# Patient Record
Sex: Male | Born: 1955 | Race: White | Hispanic: No | Marital: Married | Smoking: Never smoker
Health system: Southern US, Community
[De-identification: ages and names within clinical notes are randomized; demographics above are authoritative.]

## PROBLEM LIST (undated history)

## (undated) DIAGNOSIS — J189 Pneumonia, unspecified organism: Secondary | ICD-10-CM

## (undated) DIAGNOSIS — M1990 Unspecified osteoarthritis, unspecified site: Secondary | ICD-10-CM

## (undated) HISTORY — PX: COLONOSCOPY: SHX174

---

## 2005-06-15 ENCOUNTER — Encounter: Admission: RE | Admit: 2005-06-15 | Discharge: 2005-06-15 | Payer: Self-pay | Admitting: Gastroenterology

## 2007-05-02 ENCOUNTER — Encounter: Admission: RE | Admit: 2007-05-02 | Discharge: 2007-05-02 | Payer: Self-pay | Admitting: Family Medicine

## 2009-06-24 IMAGING — CR DG CHEST 2V
2 series · 2 of 2 positions shown · non-contrast
Comparison: None.

CLINICAL DATA: Cough and fever.
 CHEST - 2 VIEW:

[view not recorded (1 of 2)]
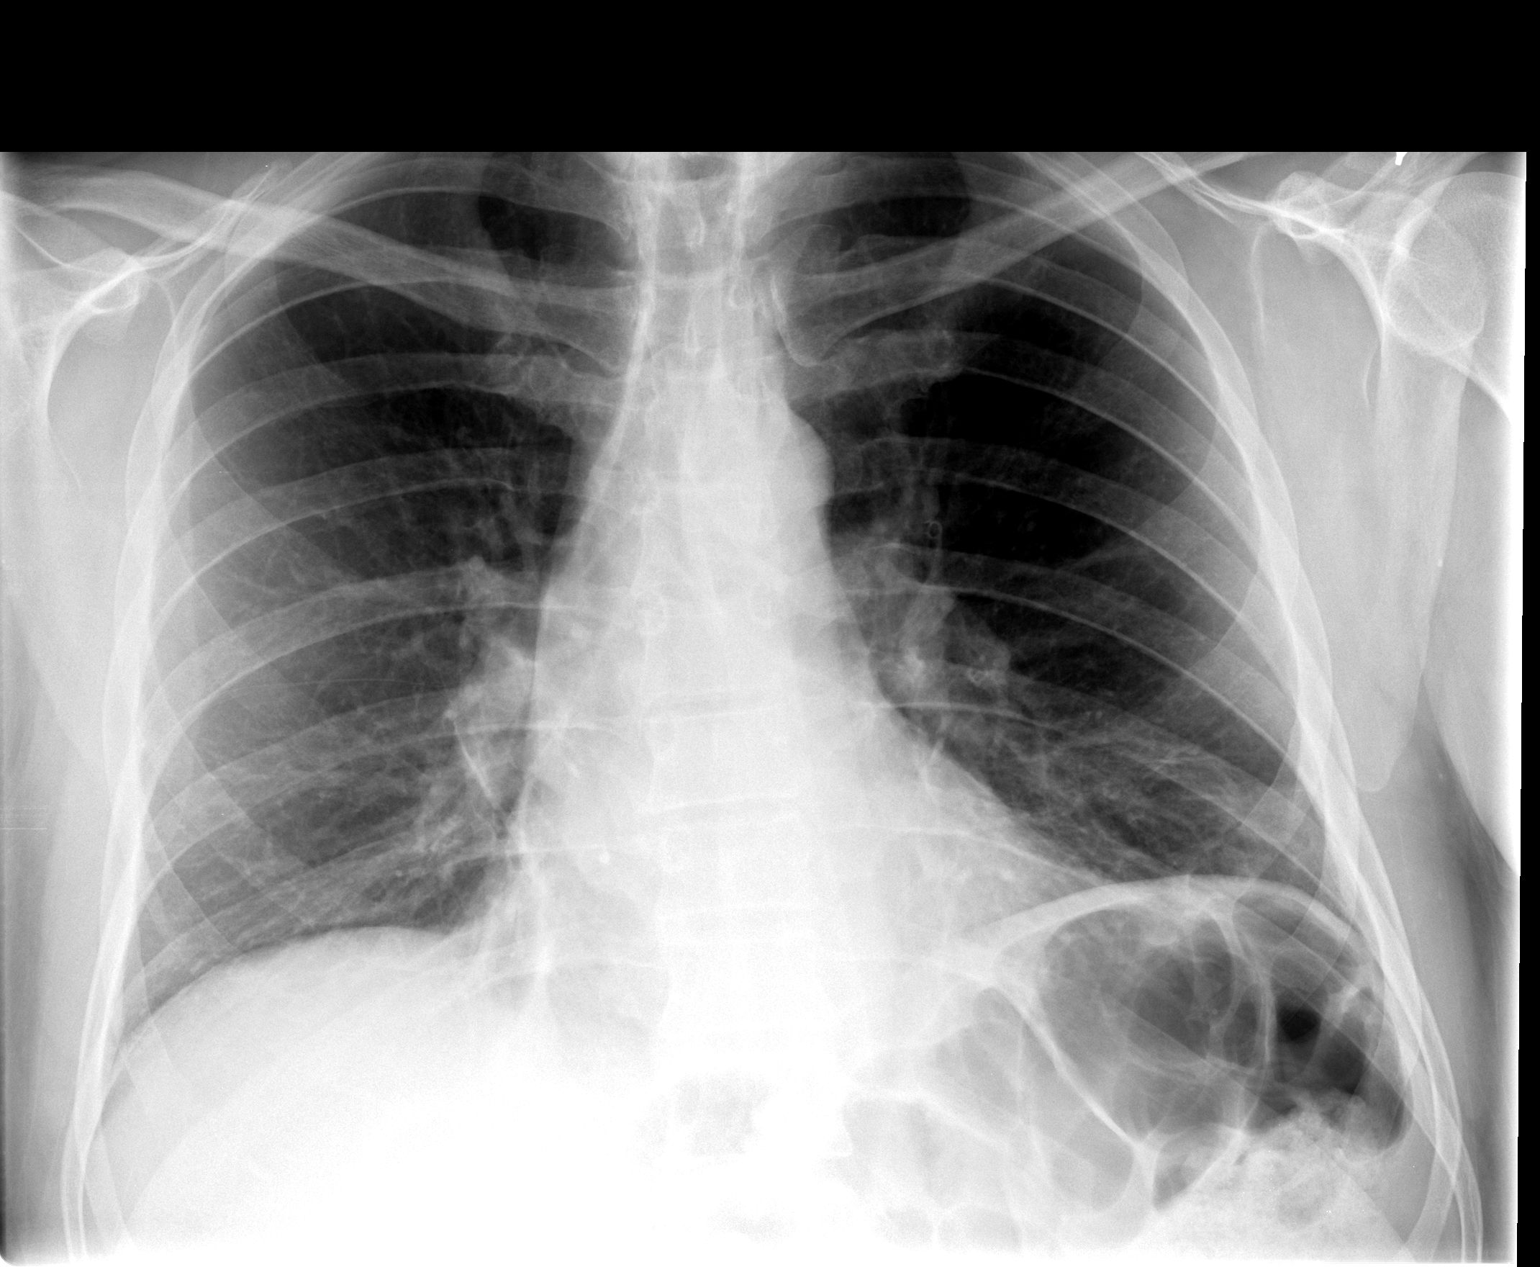

[view not recorded (2 of 2)]
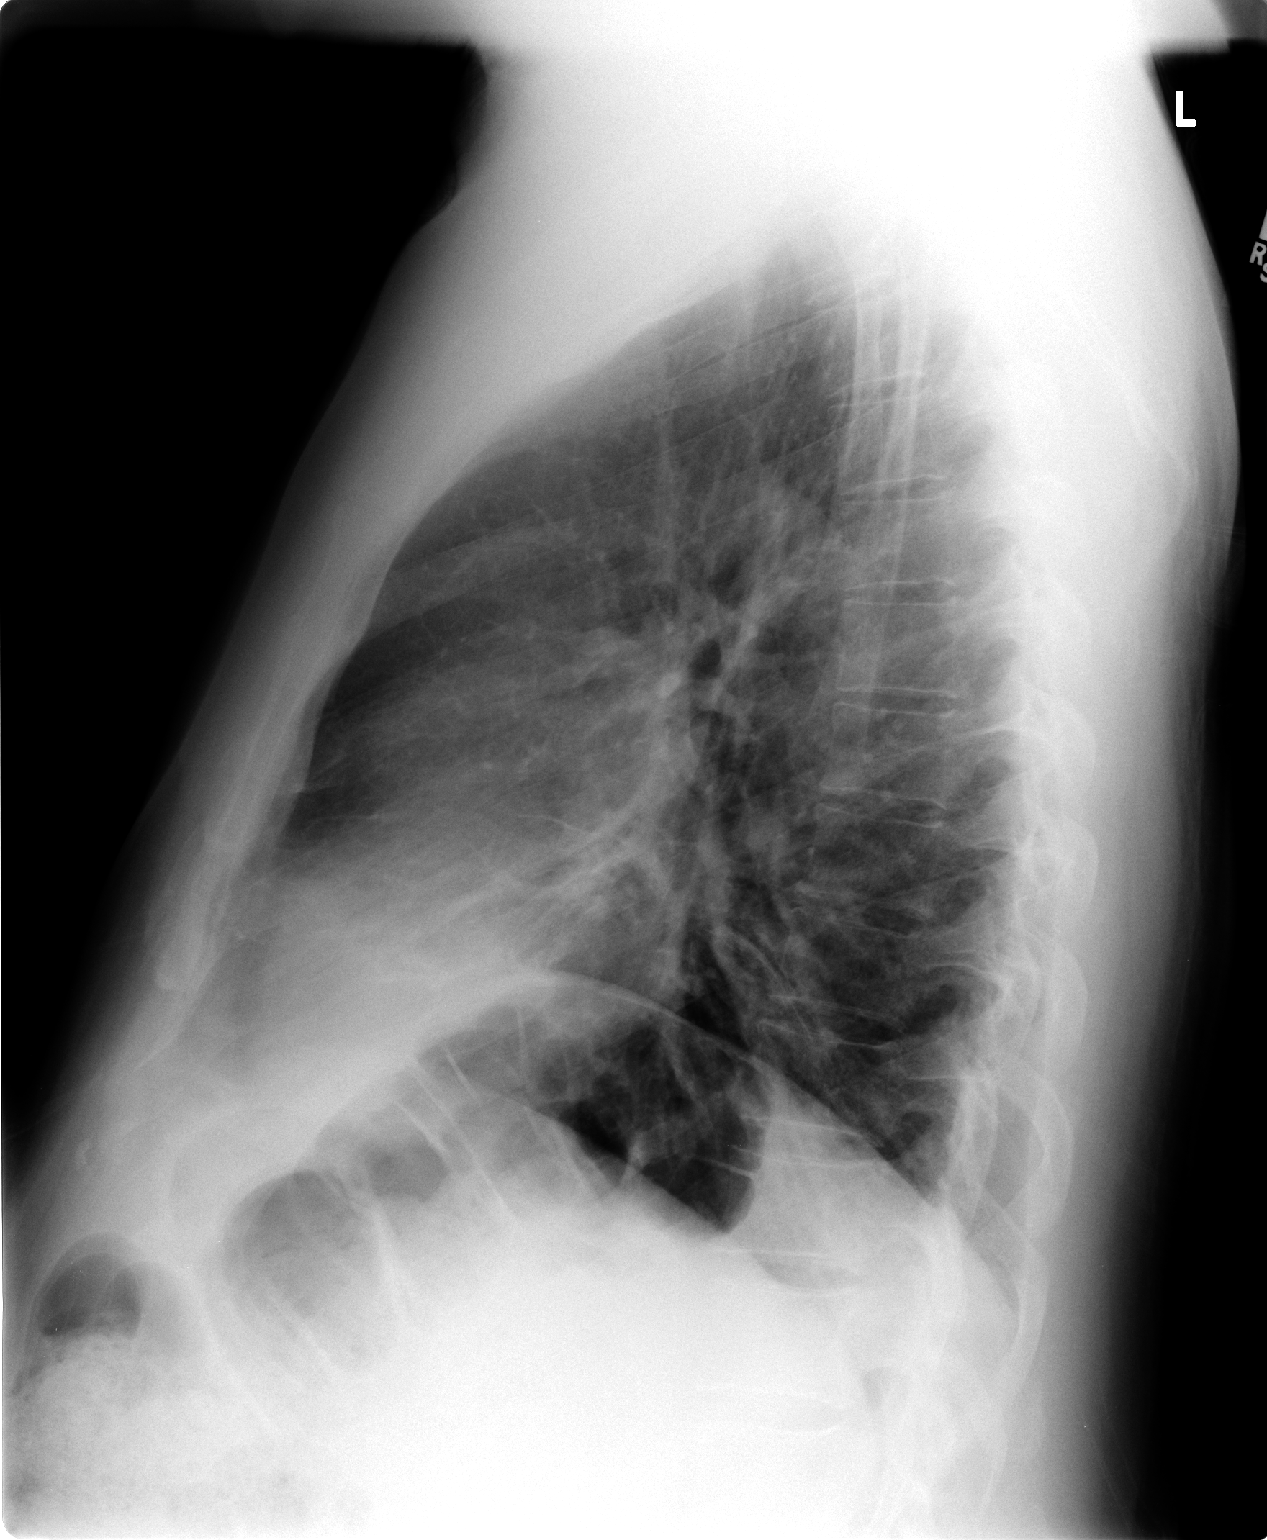

[2 of 2 positions shown; findings below may reference images not displayed]

FINDINGS: The heart size is normal.   There is no heart failure, infiltrate or effusion.  There is mild scarring in the bases.  No mass lesion is identified.
IMPRESSION: Mild bibasilar scarring.  No acute abnormality.

## 2011-05-31 ENCOUNTER — Encounter (INDEPENDENT_AMBULATORY_CARE_PROVIDER_SITE_OTHER): Payer: Managed Care, Other (non HMO) | Admitting: Ophthalmology

## 2011-05-31 DIAGNOSIS — H3530 Unspecified macular degeneration: Secondary | ICD-10-CM

## 2011-05-31 DIAGNOSIS — H47329 Drusen of optic disc, unspecified eye: Secondary | ICD-10-CM

## 2011-05-31 DIAGNOSIS — H2510 Age-related nuclear cataract, unspecified eye: Secondary | ICD-10-CM

## 2011-05-31 DIAGNOSIS — H43819 Vitreous degeneration, unspecified eye: Secondary | ICD-10-CM

## 2019-05-24 ENCOUNTER — Ambulatory Visit: Payer: Managed Care, Other (non HMO) | Attending: Internal Medicine

## 2019-05-24 DIAGNOSIS — Z23 Encounter for immunization: Secondary | ICD-10-CM

## 2019-05-24 NOTE — Progress Notes (Signed)
   Covid-19 Vaccination Clinic  Name:  Patrick Fisher    MRN: [id number] DOB: 1955-04-05  05/24/2019  [name] was observed post Covid-19 immunization for 15 minutes without incident. He was provided with Vaccine Information Sheet and instruction to access the V-Safe system.   [name] was instructed to call 911 with any severe reactions post vaccine: Marland Kitchen Difficulty breathing  . Swelling of face and throat  . A fast heartbeat  . A bad rash all over body  . Dizziness and weakness   Immunizations Administered    Name Date Dose VIS Date Route   Pfizer COVID-19 Vaccine 05/24/2019  9:02 AM 0.3 mL 01/31/2019 Intramuscular   Manufacturer: [employer], [employer]   Lot: [id number]   NDC: [address]-0

## 2019-06-17 ENCOUNTER — Ambulatory Visit: Payer: Managed Care, Other (non HMO) | Attending: Internal Medicine

## 2019-06-17 DIAGNOSIS — Z23 Encounter for immunization: Secondary | ICD-10-CM

## 2019-06-17 NOTE — Progress Notes (Signed)
   Covid-19 Vaccination Clinic  Name:  Pesach Frisch    MRN: [id number] DOB: December 21, 1955  06/17/2019  Mr. Franssen was observed post Covid-19 immunization for 15 minutes without incident. He was provided with Vaccine Information Sheet and instruction to access the V-Safe system.   Mr. Reiswig was instructed to call 911 with any severe reactions post vaccine: Marland Kitchen Difficulty breathing  . Swelling of face and throat  . A fast heartbeat  . A bad rash all over body  . Dizziness and weakness   Immunizations Administered    Name Date Dose VIS Date Route   Pfizer COVID-19 Vaccine 06/17/2019  9:14 AM 0.3 mL 04/16/2018 Intramuscular   Manufacturer: [employer], [employer]   Lot: [id number]   NDC: [address]-8

## 2021-01-15 LAB — COLOGUARD: COLOGUARD: NEGATIVE

## 2023-03-01 NOTE — Progress Notes (Signed)
 Cholesterol levels responded beautifully to the new medication. Continue current dose.

## 2023-03-01 NOTE — Progress Notes (Signed)
 [facility] [place]  Return Visit Nadeem Romanoski DOB: June 09, 1955  MRN: [id number] Visit Date: 03/01/2023  Encounter Provider: Lucie Reus, MD  Subjective:   Courtez Twaddle is a 68 y.o. male with a PMH of  HLD and ascending aortic dilitation   Presents today for follow up of HLD and of rash  HLD Had wanted to avoid statin therapy and optimize lifestyle factors. Continued to lose more weight this past year with diet and activity. Sept 2024 blood work looked good. Despite the lipid panel looking mostly okay, I recommended a statin based on the coronary calcium  scan results. Started crestor  5mg  and asa 81, which he is tolerating well 10/30/22 CORONARY CALCIUM : Total Agatston score: 246 MESA database percentile: 60th  Ascending aortic aneurysm: Echo 11/09/21: concentric LV remodelling w/ normal wall motion, normal EF, normal diastolic fxn. No valvular disease. 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 Recommend chest CTA in 1 year (sept 2025) to ensure stability.   LIFESTYLE:  ---Exercise: Active outdoor work (community garden) ---Diet: primarily vegetarian now. No sugary beverages. ---EtOH use: rare  Rash: resolved w/ hydrocortisone cream Description from Sept 2024 visit: Itchy rash on upper back medial to the right scapula. Red and raised and a little bumpy. Uncertain if rash is 2/2 itching or itching is a symptom of the rash. Has been an issue since he retired. He is fairly active with gardening, etc and doesn't always shower right away after physical activities. He has been using OTC hydrocortisone cream, which takes a couple weeks to resolve but always comes back. Unsure if the appearance of the rash resolves completely or just the itching symptom. Hx of frequent poison oak rashes. Ddx included notalgia paresthetica, a dermatophyte infection, eczema,  or, less likely, CTCL. Since he was uncertain if the itching and scratching resulted in the visible rash or if the rash itself was apparent prior to itching, and unsure if it completely resolves between episodes of itching, he monitored it more closely. It then resolved.  History of Present Illness There is also the presence of a dry, scaly reddish rash on his forehead near his eyebrows, along with his temples, ears, and sides of his nose. Additionally, he mentions a small, persistent dry patch near on his right cheek below his eye that has been present for 3 to 4 years. This patch occasionally becomes scaly, and he admits to picking at it. He recalls a dermatology consultation approximately 4 to 5 years ago, during which several lesions were removed and some were treated with cryotherapy.      Objective:  BP 125/77   Pulse 64   Temp 98.2 F (36.8 C)   Resp 16   Ht 1.905 m (6' 3)   Wt 101 kg (222 lb)   SpO2 97%   BMI 27.75 kg/m   Physical Exam Constitutional:      General: He is not in acute distress.    Appearance: Normal appearance.  HENT:     Head: Normocephalic and atraumatic.  Eyes:     General: No scleral icterus. Cardiovascular:     Rate and Rhythm: Normal rate.  Pulmonary:     Effort: Pulmonary effort is normal.  Skin:    General: Skin is warm and dry.     Coloration: Skin is not jaundiced.     Findings: Lesion (minimally erythematous slightly tan waxy scaly irregular crusty plaque  on right cheek) and rash (erythematous and papular or scaled crusty patches scattered near eyebrows, temples, and some scale on sides of nose and in external ears) present.  Neurological:     Mental Status: He is alert and oriented to person, place, and time. Mental status is at baseline.  Psychiatric:        Mood and Affect: Mood normal.        Behavior: Behavior normal.        Thought Content: Thought content normal.        Judgment: Judgment normal.     Physical Exam      Labs:  Results    No results found for this or any previous visit (from the past week).   Assessment/Plan:   Assessment & Plan   1. Pure hypercholesterolemia (Primary) Recheck since statin initiation - Lipid Panel  2. Agatston coronary artery calcium  score between 200 and 399 Continue statin and baby aspirin  - Lipid Panel  3. Aneurysm of ascending aorta without rupture (CMD) Get CTA or MRA chest annually- will order at next visit - Lipid Panel  4. Need for vaccination Administered today  - Flu, High-Dose, Trivalent, PF IM  5. Encounter for vaccination Administered today  - Moderna Covid-19 12+ yrs  6. Rash Resolved. Suspect allergic dermatitis. Monitor for recurrence  7. Seborrheic dermatitis Mild facial seb derm. Counseled on diagnosis and treatment. Can consider low potency topical steroid as well.  - ketoconazole (NIZORAL) 2 % shampoo; Apply topically 2 (two) times a week. Apply daily to damp skin of facial hair, including at the temples and eyebrows. Lather, leave on 5 minutes, and rinse. Use as shampoo on scalp at least twice per week, making sure to extend the lathering to the temples, eyebrows, and facial hair, including the entrance of the ears, as well. Allow to sit for 5 minutes prior to rinsing. Once resolved, consider using as maintenance/prevention therapy once weekly.  Dispense: 120 mL; Refill: 11 - ketoconazole (NIZORAL) 2 % cream; Apply twice per day to scaly/crusty skin on/above eyebrows, sides of nose, entrance to ear canals, and temples until fully resolved. Use in conjunction with ketoconazole shampoo.  Dispense: 30 g; Refill: 11  8. Lesion of skin of cheek Ddx includes a plaque of seb derm, an inflamed seb k, or an indolent SCC. Include region in treatment of seb derm, and if no full resolution then follow up with derm for further eval of lesion of interest (right cheek) - Ambulatory Referral to Dermatology; Future   No follow-ups on file. Next  appt scheduled for sept  There are no Patient Instructions on file for this visit.   I have personally spent 45 minutes involved in face-to-face and non-face-to-face activities for this patient on the day of the visit.  Professional time spent includes the following activities, in addition to those noted in the documentation:  - preparing to see the patient (e.g., review of recent and/or remote lab/imaging/study results, provider notes, and patient messages/phone calls available in current EMR, CareEverywhere, and scanned records) -obtaining and/or reviewing separately obtained history either through past provider notes, patient phone calls, and/or patient's family member(s)/caregiver(s) -performing a medically appropriate examination and/or evaluation -counseling and educating the patient/family/caregiver -ordering medications, tests, or procedures -documenting clinical information in the electronic or other health record -reviewing most up to date studies or expert consensus guidelines for screening/diagnosing/treating pertinent conditions/symptoms -independently interpreting results (not separately reported) and communicating results to the patient/family/caregiver -care coordination (not separately reported) -referring and  communicating with other health care professionals (when not separately reported)   Electronically signed by: Lucie Reus, MD 03/01/2023 9:07 AM

## 2023-11-09 LAB — COLOGUARD: COLOGUARD: NEGATIVE

## 2023-12-18 ENCOUNTER — Ambulatory Visit: Payer: Self-pay | Admitting: Orthopedic Surgery

## 2023-12-18 DIAGNOSIS — M1711 Unilateral primary osteoarthritis, right knee: Secondary | ICD-10-CM

## 2024-01-01 NOTE — Patient Instructions (Signed)
 SURGICAL WAITING ROOM VISITATION  Patients having surgery or a procedure may have no more than 2 support people in the waiting area - these visitors may rotate.    Children under the age of 48 must have an adult with them who is not the patient.  Visitors with respiratory illnesses are discouraged from visiting and should remain at home.  If the patient needs to stay at the hospital during part of their recovery, the visitor guidelines for inpatient rooms apply. Pre-op nurse will coordinate an appropriate time for 1 support person to accompany patient in pre-op.  This support person may not rotate.    Please refer to the [facility] website for the visitor guidelines for Inpatients (after your surgery is over and you are in a regular room).       Your procedure is scheduled on: 01/16/24   Report to [facility] Main Entrance    Report to admitting at 5:15 AM   Call this number if you have problems the morning of surgery [phone]   Do not eat food :After Midnight.   After Midnight you may have the following liquids until 4:30 AM DAY OF SURGERY  Water Non-Citrus Juices (without pulp, NO RED-Apple, White grape, White cranberry) Black Coffee (NO MILK/CREAM OR CREAMERS, sugar ok)  Clear Tea (NO MILK/CREAM OR CREAMERS, sugar ok) regular and decaf                             Plain Jell-O (NO RED)                                           Fruit ices (not with fruit pulp, NO RED)                                     Popsicles (NO RED)                                                               Sports drinks like Gatorade (NO RED)                   The day of surgery:  Drink ONE (1) Pre-Surgery G2 at 4:30 AM the morning of surgery. Drink in one sitting. Do not sip.  This drink was given to you during your hospital  pre-op appointment visit. Nothing else to drink after completing the  Pre-Surgery  G2.      Oral Hygiene is also important to reduce your risk of  infection.                                    Remember - BRUSH YOUR TEETH THE MORNING OF SURGERY WITH YOUR REGULAR TOOTHPASTE  DENTURES WILL BE REMOVED PRIOR TO SURGERY PLEASE DO NOT APPLY Poly grip OR ADHESIVES!!!     Stop all vitamins and herbal supplements 7 days before surgery.   Take these medicines the morning of surgery with A SIP OF WATER: Rosuvastatin  You may not have any metal on your body including hair pins, jewelry, and body piercing             Do not wear make-up, lotions, powders, perfumes/cologne, or deodorant              Men may shave face and neck.   Do not bring valuables to the hospital. [facility] IS NOT             RESPONSIBLE   FOR VALUABLES.   Contacts, glasses, dentures or bridgework may not be worn into surgery.   Bring small overnight bag day of surgery.   DO NOT BRING YOUR HOME MEDICATIONS TO THE HOSPITAL. PHARMACY WILL DISPENSE MEDICATIONS LISTED ON YOUR MEDICATION LIST TO YOU DURING YOUR ADMISSION IN THE HOSPITAL!    Patients discharged on the day of surgery will not be allowed to drive home.  Someone NEEDS to stay with you for the first 24 hours after anesthesia.   Special Instructions: Bring a copy of your healthcare power of attorney and living will documents the day of surgery if you haven't scanned them before.              Please read over the following fact sheets you were given: IF YOU HAVE QUESTIONS ABOUT YOUR PRE-OP INSTRUCTIONS PLEASE CALL [phone] [place]   If you received a COVID test during your pre-op visit  it is requested that you wear a mask when out in public, stay away from anyone that may not be feeling well and notify your surgeon if you develop symptoms. If you test positive for Covid or have been in contact with anyone that has tested positive in the last 10 days please notify you surgeon.      Pre-operative 4 CHG Bath Instructions  DYNA-Hex 4 Chlorhexidine Gluconate 4% Solution Antiseptic 4 fl.  oz   You can play a key role in reducing the risk of infection after surgery. Your skin needs to be as free of germs as possible. You can reduce the number of germs on your skin by washing with CHG (chlorhexidine gluconate) soap before surgery. CHG is an antiseptic soap that kills germs and continues to kill germs even after washing.   DO NOT use if you have an allergy to chlorhexidine/CHG or antibacterial soaps. If your skin becomes reddened or irritated, stop using the CHG and notify one of our RNs at   Please shower with the CHG soap starting 4 days before surgery using the following schedule:     Please keep in mind the following:  DO NOT shave, including legs and underarms, starting the day of your first shower.   You may shave your face at any point before/day of surgery.  Place clean sheets on your bed the day you start using CHG soap. Use a clean washcloth (not used since being washed) for each shower. DO NOT sleep with pets once you start using the CHG.  CHG Shower Instructions:  If you choose to wash your hair and private area, wash first with your normal shampoo/soap.  After you use shampoo/soap, rinse your hair and body thoroughly to remove shampoo/soap residue.  Turn the water OFF and apply about 3 tablespoons (45 ml) of CHG soap to a CLEAN washcloth.  Apply CHG soap ONLY FROM YOUR NECK DOWN TO YOUR TOES (washing for 3-5 minutes)  DO NOT use CHG soap on face, private areas, open wounds, or sores.  Pay special attention to the area where  your surgery is being performed.  If you are having back surgery, having someone wash your back for you may be helpful. Wait 2 minutes after CHG soap is applied, then you may rinse off the CHG soap.  Pat dry with a clean towel  Put on clean clothes/pajamas   If you choose to wear lotion, please use ONLY the CHG-compatible lotions on the back of this paper.     Additional instructions for the day of surgery: DO NOT APPLY any lotions,  deodorants, cologne, or perfumes.   Put on clean/comfortable clothes.  Brush your teeth.  Ask your nurse before applying any prescription medications to the skin.   CHG Compatible Lotions   Aveeno Moisturizing lotion  Cetaphil Moisturizing Cream  Cetaphil Moisturizing Lotion  Clairol Herbal Essence Moisturizing Lotion, Dry Skin  Clairol Herbal Essence Moisturizing Lotion, Extra Dry Skin  Clairol Herbal Essence Moisturizing Lotion, Normal Skin  Curel Age Defying Therapeutic Moisturizing Lotion with Alpha Hydroxy  Curel Extreme Care Body Lotion  Curel Soothing Hands Moisturizing Hand Lotion  Curel Therapeutic Moisturizing Cream, Fragrance-Free  Curel Therapeutic Moisturizing Lotion, Fragrance-Free  Curel Therapeutic Moisturizing Lotion, Original Formula  Eucerin Daily Replenishing Lotion  Eucerin Dry Skin Therapy Plus Alpha Hydroxy Crme  Eucerin Dry Skin Therapy Plus Alpha Hydroxy Lotion  Eucerin Original Crme  Eucerin Original Lotion  Eucerin Plus Crme Eucerin Plus Lotion  Eucerin TriLipid Replenishing Lotion  Keri Anti-Bacterial Hand Lotion  Keri Deep Conditioning Original Lotion Dry Skin Formula Softly Scented  Keri Deep Conditioning Original Lotion, Fragrance Free Sensitive Skin Formula  Keri Lotion Fast Absorbing Fragrance Free Sensitive Skin Formula  Keri Lotion Fast Absorbing Softly Scented Dry Skin Formula  Keri Original Lotion  Keri Skin Renewal Lotion Keri Silky Smooth Lotion  Keri Silky Smooth Sensitive Skin Lotion  Nivea Body Creamy Conditioning Oil  Nivea Body Extra Enriched Lotion  Nivea Body Original Lotion  Nivea Body Sheer Moisturizing Lotion Nivea Crme  Nivea Skin Firming Lotion  NutraDerm 30 Skin Lotion  NutraDerm Skin Lotion  NutraDerm Therapeutic Skin Cream  NutraDerm Therapeutic Skin Lotion  ProShield Protective Hand Cream  Incentive Spirometer  An incentive spirometer is a tool that can help keep your lungs clear and active. This tool measures  how well you are filling your lungs with each breath. Taking long deep breaths may help reverse or decrease the chance of developing breathing (pulmonary) problems (especially infection) following: A long period of time when you are unable to move or be active. BEFORE THE PROCEDURE  If the spirometer includes an indicator to show your best effort, your nurse or respiratory therapist will set it to a desired goal. If possible, sit up straight or lean slightly forward. Try not to slouch. Hold the incentive spirometer in an upright position. INSTRUCTIONS FOR USE  Sit on the edge of your bed if possible, or sit up as far as you can in bed or on a chair. Hold the incentive spirometer in an upright position. Breathe out normally. Place the mouthpiece in your mouth and seal your lips tightly around it. Breathe in slowly and as deeply as possible, raising the piston or the ball toward the top of the column. Hold your breath for 3-5 seconds or for as long as possible. Allow the piston or ball to fall to the bottom of the column. Remove the mouthpiece from your mouth and breathe out normally. Rest for a few seconds and repeat Steps 1 through 7 at least 10 times every 1-2 hours  when you are awake. Take your time and take a few normal breaths between deep breaths. The spirometer may include an indicator to show your best effort. Use the indicator as a goal to work toward during each repetition. After each set of 10 deep breaths, practice coughing to be sure your lungs are clear. If you have an incision (the cut made at the time of surgery), support your incision when coughing by placing a pillow or rolled up towels firmly against it. Once you are able to get out of bed, walk around indoors and cough well. You may stop using the incentive spirometer when instructed by your caregiver.  RISKS AND COMPLICATIONS Take your time so you do not get dizzy or light-headed. If you are in pain, you may need to take or  ask for pain medication before doing incentive spirometry. It is harder to take a deep breath if you are having pain. AFTER USE Rest and breathe slowly and easily. It can be helpful to keep track of a log of your progress. Your caregiver can provide you with a simple table to help with this. If you are using the spirometer at home, follow these instructions: SEEK MEDICAL CARE IF:  You are having difficultly using the spirometer. You have trouble using the spirometer as often as instructed. Your pain medication is not giving enough relief while using the spirometer. You develop fever of 100.5 F (38.1 C) or higher. SEEK IMMEDIATE MEDICAL CARE IF:  You cough up bloody sputum that had not been present before. You develop fever of 102 F (38.9 C) or greater. You develop worsening pain at or near the incision site. MAKE SURE YOU:  Understand these instructions. Will watch your condition. Will get help right away if you are not doing well or get worse. Document Released: 06/19/2006 Document Revised: 05/01/2011 Document Reviewed: 08/20/2006 [facility] Patient Information 2014 [place], [place].

## 2024-01-01 NOTE — Progress Notes (Signed)
 COVID Vaccine received:  []  No [x]  Yes Date of any COVID positive Test in last 90 days: no PCP - Dr. Anita w/ [place] [facility] Cardiologist - [facility]  Chest x-ray -  EKG -   Stress Test -  ECHO -  Cardiac Cath -   Bowel Prep - [x]  No  []   Yes ______  Pacemaker / ICD device [x]  No []  Yes   Spinal Cord Stimulator:[x]  No []  Yes       History of Sleep Apnea? [x]  No []  Yes   CPAP used?- []  No []  Yes    Does the patient monitor blood sugar?          [x]  No []  Yes  []  N/A  Patient has: [x]  NO Hx DM   []  Pre-DM                 []  DM1  []   DM2 Does patient have a [employer] or Dexacom? []  No []  Yes   Fasting Blood Sugar Ranges-  Checks Blood Sugar _____ times a day  GLP1 agonist / usual dose - no GLP1 instructions:  SGLT-2 inhibitors / usual dose - no SGLT-2 instructions:   Blood Thinner / Instructions:no Aspirin Instructions:ASA 81 mg stop between 01/08/10/25  Comments:   Activity level: Patient is able  to climb a flight of stairs without difficulty; [x]  No CP  [x]  No SOB,    Patient can  perform ADLs without assistance.   Anesthesia review: Ascending aorta dilitation  Patient denies shortness of breath, fever, cough and chest pain at PAT appointment.  Patient verbalized understanding and agreement to the Pre-Surgical Instructions that were given to them at this PAT appointment. Patient was also educated of the need to review these PAT instructions again prior to his/her surgery.I reviewed the appropriate phone numbers to call if they have any and questions or concerns.

## 2024-01-03 ENCOUNTER — Encounter (HOSPITAL_COMMUNITY)
Admission: RE | Admit: 2024-01-03 | Discharge: 2024-01-03 | Disposition: A | Discharge status: Home / Self Care | Source: Ambulatory Visit | Attending: Specialist | Admitting: Specialist

## 2024-01-03 ENCOUNTER — Encounter (HOSPITAL_COMMUNITY): Payer: Self-pay

## 2024-01-03 ENCOUNTER — Other Ambulatory Visit: Payer: Self-pay

## 2024-01-03 ENCOUNTER — Ambulatory Visit (HOSPITAL_COMMUNITY)
Admission: RE | Admit: 2024-01-03 | Discharge: 2024-01-03 | Disposition: A | Discharge status: Home / Self Care | Source: Ambulatory Visit | Attending: Orthopedic Surgery | Admitting: Orthopedic Surgery

## 2024-01-03 VITALS — BP 122/76 | HR 75 | Temp 97.6°F | Resp 16 | Ht 75.0 in | Wt 216.0 lb

## 2024-01-03 DIAGNOSIS — M1711 Unilateral primary osteoarthritis, right knee: Secondary | ICD-10-CM | POA: Insufficient documentation

## 2024-01-03 DIAGNOSIS — Z01818 Encounter for other preprocedural examination: Secondary | ICD-10-CM | POA: Insufficient documentation

## 2024-01-03 HISTORY — DX: Pneumonia, unspecified organism: J18.9

## 2024-01-03 HISTORY — DX: Unspecified osteoarthritis, unspecified site: M19.90

## 2024-01-03 LAB — CBC
HCT: 36.1 % — ABNORMAL LOW (ref 39.0–52.0)
Hemoglobin: 11.7 g/dL — ABNORMAL LOW (ref 13.0–17.0)
MCH: 30 pg (ref 26.0–34.0)
MCHC: 32.4 g/dL (ref 30.0–36.0)
MCV: 92.6 fL (ref 80.0–100.0)
Platelets: 291 K/uL (ref 150–400)
RBC: 3.9 MIL/uL — ABNORMAL LOW (ref 4.22–5.81)
RDW: 13.1 % (ref 11.5–15.5)
WBC: 8 K/uL (ref 4.0–10.5)
nRBC: 0 % (ref 0.0–0.2)

## 2024-01-03 LAB — SURGICAL PCR SCREEN
MRSA, PCR: NEGATIVE
Staphylococcus aureus: NEGATIVE

## 2024-01-09 NOTE — Progress Notes (Signed)
 Anesthesia Chart Review   Case: [id number] Date/Time: 01/16/24 0715   Procedure: ARTHROPLASTY, KNEE, TOTAL (Right: Knee)   Anesthesia type: Spinal   Diagnosis: Primary osteoarthritis of right knee [M17.11]   Pre-op diagnosis: degenerative joint disease right knee   Location: [facility] ROOM 10 / [facility]   Surgeons: Duwayne Purchase, MD       DISCUSSION:68 y.o. never smoker with h/o right knee djd scheduled for above procedure 01/16/2024 with Dr. Purchase Duwayne.   Coronary calcium score 246, pt started on statin.  Reports he can climb a flight of stairs without chest pain or shortness of breath.   AAA 4.5 cm on recent CT Angio Chest, stable from previous echo.  Followed by PCP with annual imaging.  VS: BP 122/76   Pulse 75   Temp 36.4 C (Oral)   Resp 16   Ht 6' 3 (1.905 m)   Wt 98 kg   SpO2 98%   BMI 27.00 kg/m   PROVIDERS: Patient, No Pcp Per   LABS: Labs reviewed: Acceptable for surgery. (all labs ordered are listed, but only abnormal results are displayed)  Labs Reviewed  CBC - Abnormal; Notable for the following components:      Result Value   RBC 3.90 (*)    Hemoglobin 11.7 (*)    HCT 36.1 (*)    All other components within normal limits  SURGICAL PCR SCREEN     IMAGES: CT Angio Chest 11/05/2023 (Care Everywhere) No acute aortic syndrome. Fusiform dilatation of ascending thoracic aorta measuring approximately 4.5 cm.   Punctate lung nodules. No further workup is needed for a low risk patient. An optional reevaluation can be performed with a low-dose CT scan of the chest for high risk patient in October 2026.   EKG:   CV: CT Cardiac Scoring 10/30/2022 (Care Everywhere) IMPRESSION:   Calcified atherosclerotic disease is evident within the coronary arterial distribution. The patient's total coronary artery calcium score is 246, which places the patient in the 60th percentile for individuals of matched age, gender and race/ethnicity. Please note, score may be  underestimated as cardiac motion caused incomplete imaging of the proximal LAD and left main coronary arteries.   Ascending aortic aneurysm measuring up to 48 mm. Consider one-year follow-up to ensure stability.   Echo 11/09/2021 SUMMARY   Moderately  dilated ascending aorta. Recommend annual imaging.   There is concentric left ventricular remodelling with normal wall  motion, normal systolic function and ejection fraction  60-65% .  Normal left ventricular diastolic function and left atrial pressure.  The right ventricle is normal in size and function.  The atria are normal in size.  There is no significant valvular stenosis or regurgitation.  There was insufficient TR detected to calculate RV systolic pressure.  The inferior vena cava was not visualized during the exam.  The aortic sinus and the aortic arch are mildly dilated at 4.1 cm. The  ascending aorta is moderately dilated at 4.6 cm.  Past Medical History:  Diagnosis Date   Arthritis    Pneumonia     Past Surgical History:  Procedure Laterality Date   COLONOSCOPY      MEDICATIONS:  aspirin EC 81 MG tablet   ketoconazole (NIZORAL) 2 % shampoo   Multiple Vitamins-Minerals (CENTRUM SILVER MEN 50+) TABS   rosuvastatin (CRESTOR) 5 MG tablet   No current facility-administered medications for this encounter.    Harlene Hoots Ward, PA-C [facility] [phone]

## 2024-01-15 ENCOUNTER — Ambulatory Visit: Payer: Self-pay | Admitting: Orthopedic Surgery

## 2024-01-15 ENCOUNTER — Encounter (HOSPITAL_COMMUNITY): Payer: Self-pay | Admitting: Specialist

## 2024-01-15 NOTE — Anesthesia Preprocedure Evaluation (Signed)
 Anesthesia Evaluation  Patient identified by MRN, date of birth, ID band Patient awake    Reviewed: Allergy & Precautions, NPO status , Patient's Chart, lab work & pertinent test results  Airway Mallampati: II  TM Distance: >3 FB Neck ROM: Full    Dental  (+) Teeth Intact, Caps, Dental Advisory Given   Pulmonary pneumonia, resolved   Pulmonary exam normal breath sounds clear to auscultation       Cardiovascular negative cardio ROS Normal cardiovascular exam Rhythm:Regular Rate:Normal     Neuro/Psych negative neurological ROS  negative psych ROS   GI/Hepatic negative GI ROS, Neg liver ROS,,,  Endo/Other  HLD  Renal/GU negative Renal ROS   negative genitourinary   Musculoskeletal  (+) Arthritis , Osteoarthritis,  DJD right knee   Abdominal   Peds  Hematology negative hematology ROS (+) Lab Results      Component                Value               Date                      WBC                      8.0                 01/03/2024                HGB                      11.7 (L)            01/03/2024                HCT                      36.1 (L)            01/03/2024                MCV                      92.6                01/03/2024                PLT                      291                 01/03/2024              Anesthesia Other Findings   Reproductive/Obstetrics                              Anesthesia Physical Anesthesia Plan  ASA: 2  Anesthesia Plan: Spinal   Post-op Pain Management: Regional block* and Minimal or no pain anticipated   Induction: Intravenous  PONV Risk Score and Plan: 2 and Treatment may vary due to age or medical condition and Propofol  infusion  Airway Management Planned: Natural Airway and Simple Face Mask  Additional Equipment: None  Intra-op Plan:   Post-operative Plan:   Informed Consent: I have reviewed the patients History and Physical,  chart, labs and discussed the procedure including the risks, benefits and alternatives for the proposed  anesthesia with the patient or authorized representative who has indicated his/her understanding and acceptance.     Dental advisory given  Plan Discussed with: CRNA and Anesthesiologist  Anesthesia Plan Comments:          Anesthesia Quick Evaluation

## 2024-01-15 NOTE — H&P (View-Only) (Signed)
 Patrick Fisher is an 68 y.o. male.   Chief Complaint: right knee pain HPI: The patient is going to have a right total knee arthroplasty performed by Dr. Reyes Billing on 01/16/2024 at [facility]. Please see electronic hospital medical record for complete details.  Dr. Billing and the patient mutually agreed to proceed with a total knee replacement. Risks and benefits of the procedure were discussed including stiffness, suboptimal range of motion, persistent pain, infection requiring removal of prosthesis and reinsertion, need for prophylactic antibiotics in the future, for example, dental procedures, possible need for manipulation, revision in the future and also anesthetic complications including DVT, PE, etc. We discussed the perioperative course, time in the hospital, postoperative recovery and the need for elevation to control swelling. We also discussed the predicted range of motion and the probability that squatting and kneeling would be unobtainable in the future. In addition, postoperative anticoagulation was discussed. We have obtained preoperative medical clearance as necessary. Provided illustrated handout and discussed it in detail. They will enroll in the total joint replacement educational forum at the hospital.  Past Medical History:  Diagnosis Date   Arthritis    Pneumonia     Past Surgical History:  Procedure Laterality Date   COLONOSCOPY      No family history on file. Social History:  reports that he has never smoked. He has never used smokeless tobacco. He reports that he does not currently use alcohol. He reports that he does not use drugs.  Allergies:  Allergies  Allergen Reactions   Mangifera Indica Hives and Itching    Severe reaction   Poison Ivy Extract Hives and Itching    Severe reaction   Shellfish Allergy Nausea And Vomiting    Bivalves only, shrimp/crab/lobster okay    Current meds: Adult Low Dose Aspirin  81 mg tablet,delayed release chlorhexidine   gluconate 4 % topical liquid Fish OiL Multivitamin 50 Plus tablet rosuvastatin   Review of Systems  Constitutional: Negative.   HENT: Negative.    Eyes: Negative.   Respiratory: Negative.    Cardiovascular: Negative.   Gastrointestinal: Negative.   Endocrine: Negative.   Genitourinary: Negative.   Musculoskeletal:  Positive for arthralgias, gait problem, joint swelling and myalgias.  Skin: Negative.   Psychiatric/Behavioral: Negative.      There were no vitals taken for this visit. Physical Exam Constitutional:      Appearance: Normal appearance.  HENT:     Head: Normocephalic and atraumatic.     Right Ear: External ear normal.     Left Ear: External ear normal.     Nose: Nose normal.     Mouth/Throat:     Pharynx: Oropharynx is clear.  Eyes:     Conjunctiva/sclera: Conjunctivae normal.  Cardiovascular:     Rate and Rhythm: Normal rate.     Pulses: Normal pulses.  Pulmonary:     Effort: Pulmonary effort is normal.  Abdominal:     General: Bowel sounds are normal.  Musculoskeletal:     Cervical back: Normal range of motion.     Comments: Patient is awake, alert, oriented 3. Well-nourished and well-developed. Antalgic gait with no assistive devices.  On examination of the knees, tender on palpation of the medial joint line. Nontender lateral joint line, patellar tendon, quadriceps tendon, patella, peroneal nerve and popliteal space. No calf pain or sign of DVT. No pain or laxity with varus or valgus stress. No instability noted. Negative McMurray's. Mild effusion noted bilaterally. ROM approx -2-110. Positive patellofemoral crepitus. No patellofemoral  pain on compression. Sensation intact distally. Pes planus.  Skin:    General: Skin is warm and dry.  Neurological:     Mental Status: He is alert.    Standing xrays from 1 year ago reviewed with end-stage medial joint space narrowing bilaterally, bone-on-bone, with varus deformity.  Assessment/Plan Impression:  End-stage right knee osteoarthritis  Plan: Pt with end-stage right knee DJD, bone-on-bone, refractory to conservative tx, scheduled for right total knee replacement by Dr. Duwayne on November 26. We again discussed the procedure itself as well as risks, complications and alternatives, including but not limited to DVT, PE, infx, bleeding, failure of procedure, need for secondary procedure including manipulation, nerve injury, ongoing pain/symptoms, anesthesia risk, even stroke or death. Also discussed typical post-op protocols, activity restrictions, need for PT, flexion/extension exercises, time out of work. Discussed need for DVT ppx post-op per protocol. Discussed dental ppx and infx prevention. Also discussed limitations post-operatively such as kneeling and squatting. All questions were answered. Patient desires to proceed with surgery as scheduled.  Will hold supplements, ASA and NSAIDs accordingly. Will remain NPO after midnight the night before surgery. Will present to [facility] for pre-op testing. Anticipate hospital stay to include at least 2 midnights given medical history and to ensure proper pain control. Plan ASA for DVT ppx post-op. Plan oxycodone , Robaxin , Colace, Miralax . Plan home with HHPT post-op with family members at home for assistance, outpatient PT to start several weeks postop. Will follow up 10-14 days post-op for staple removal and xrays.  Plan Right total knee arthroplasty  Darice CHRISTELLA Randy, PA-C for Dr Duwayne 01/15/2024, 8:25 AM

## 2024-01-15 NOTE — H&P (Signed)
 Patrick Fisher is an 68 y.o. male.   Chief Complaint: right knee pain HPI: The patient is going to have a right total knee arthroplasty performed by Dr. Reyes Billing on 01/16/2024 at [facility]. Please see electronic hospital medical record for complete details.  Dr. Billing and the patient mutually agreed to proceed with a total knee replacement. Risks and benefits of the procedure were discussed including stiffness, suboptimal range of motion, persistent pain, infection requiring removal of prosthesis and reinsertion, need for prophylactic antibiotics in the future, for example, dental procedures, possible need for manipulation, revision in the future and also anesthetic complications including DVT, PE, etc. We discussed the perioperative course, time in the hospital, postoperative recovery and the need for elevation to control swelling. We also discussed the predicted range of motion and the probability that squatting and kneeling would be unobtainable in the future. In addition, postoperative anticoagulation was discussed. We have obtained preoperative medical clearance as necessary. Provided illustrated handout and discussed it in detail. They will enroll in the total joint replacement educational forum at the hospital.  Past Medical History:  Diagnosis Date   Arthritis    Pneumonia     Past Surgical History:  Procedure Laterality Date   COLONOSCOPY      No family history on file. Social History:  reports that he has never smoked. He has never used smokeless tobacco. He reports that he does not currently use alcohol. He reports that he does not use drugs.  Allergies:  Allergies  Allergen Reactions   Mangifera Indica Hives and Itching    Severe reaction   Poison Ivy Extract Hives and Itching    Severe reaction   Shellfish Allergy Nausea And Vomiting    Bivalves only, shrimp/crab/lobster okay    Current meds: Adult Low Dose Aspirin  81 mg tablet,delayed release chlorhexidine   gluconate 4 % topical liquid Fish OiL Multivitamin 50 Plus tablet rosuvastatin   Review of Systems  Constitutional: Negative.   HENT: Negative.    Eyes: Negative.   Respiratory: Negative.    Cardiovascular: Negative.   Gastrointestinal: Negative.   Endocrine: Negative.   Genitourinary: Negative.   Musculoskeletal:  Positive for arthralgias, gait problem, joint swelling and myalgias.  Skin: Negative.   Psychiatric/Behavioral: Negative.      There were no vitals taken for this visit. Physical Exam Constitutional:      Appearance: Normal appearance.  HENT:     Head: Normocephalic and atraumatic.     Right Ear: External ear normal.     Left Ear: External ear normal.     Nose: Nose normal.     Mouth/Throat:     Pharynx: Oropharynx is clear.  Eyes:     Conjunctiva/sclera: Conjunctivae normal.  Cardiovascular:     Rate and Rhythm: Normal rate.     Pulses: Normal pulses.  Pulmonary:     Effort: Pulmonary effort is normal.  Abdominal:     General: Bowel sounds are normal.  Musculoskeletal:     Cervical back: Normal range of motion.     Comments: Patient is awake, alert, oriented 3. Well-nourished and well-developed. Antalgic gait with no assistive devices.  On examination of the knees, tender on palpation of the medial joint line. Nontender lateral joint line, patellar tendon, quadriceps tendon, patella, peroneal nerve and popliteal space. No calf pain or sign of DVT. No pain or laxity with varus or valgus stress. No instability noted. Negative McMurray's. Mild effusion noted bilaterally. ROM approx -2-110. Positive patellofemoral crepitus. No patellofemoral  pain on compression. Sensation intact distally. Pes planus.  Skin:    General: Skin is warm and dry.  Neurological:     Mental Status: He is alert.    Standing xrays from 1 year ago reviewed with end-stage medial joint space narrowing bilaterally, bone-on-bone, with varus deformity.  Assessment/Plan Impression:  End-stage right knee osteoarthritis  Plan: Pt with end-stage right knee DJD, bone-on-bone, refractory to conservative tx, scheduled for right total knee replacement by Dr. Duwayne on November 26. We again discussed the procedure itself as well as risks, complications and alternatives, including but not limited to DVT, PE, infx, bleeding, failure of procedure, need for secondary procedure including manipulation, nerve injury, ongoing pain/symptoms, anesthesia risk, even stroke or death. Also discussed typical post-op protocols, activity restrictions, need for PT, flexion/extension exercises, time out of work. Discussed need for DVT ppx post-op per protocol. Discussed dental ppx and infx prevention. Also discussed limitations post-operatively such as kneeling and squatting. All questions were answered. Patient desires to proceed with surgery as scheduled.  Will hold supplements, ASA and NSAIDs accordingly. Will remain NPO after midnight the night before surgery. Will present to [facility] for pre-op testing. Anticipate hospital stay to include at least 2 midnights given medical history and to ensure proper pain control. Plan ASA for DVT ppx post-op. Plan oxycodone , Robaxin , Colace, Miralax . Plan home with HHPT post-op with family members at home for assistance, outpatient PT to start several weeks postop. Will follow up 10-14 days post-op for staple removal and xrays.  Plan Right total knee arthroplasty  Patrick CHRISTELLA Randy, PA-C for Dr Duwayne 01/15/2024, 8:25 AM

## 2024-01-16 ENCOUNTER — Encounter (HOSPITAL_COMMUNITY): Admission: RE | Disposition: A | Payer: Self-pay | Source: Ambulatory Visit | Attending: Specialist

## 2024-01-16 ENCOUNTER — Ambulatory Visit (HOSPITAL_COMMUNITY)

## 2024-01-16 ENCOUNTER — Ambulatory Visit (HOSPITAL_COMMUNITY): Payer: Self-pay | Admitting: Medical

## 2024-01-16 ENCOUNTER — Ambulatory Visit (HOSPITAL_BASED_OUTPATIENT_CLINIC_OR_DEPARTMENT_OTHER): Payer: Self-pay | Admitting: Anesthesiology

## 2024-01-16 ENCOUNTER — Ambulatory Visit (HOSPITAL_COMMUNITY)
Admission: RE | Admit: 2024-01-16 | Discharge: 2024-01-17 | Disposition: A | Discharge status: Home / Self Care | Source: Ambulatory Visit | Attending: Specialist | Admitting: Specialist

## 2024-01-16 ENCOUNTER — Other Ambulatory Visit: Payer: Self-pay

## 2024-01-16 ENCOUNTER — Encounter (HOSPITAL_COMMUNITY): Payer: Self-pay | Admitting: Specialist

## 2024-01-16 DIAGNOSIS — E785 Hyperlipidemia, unspecified: Secondary | ICD-10-CM | POA: Insufficient documentation

## 2024-01-16 DIAGNOSIS — M21161 Varus deformity, not elsewhere classified, right knee: Secondary | ICD-10-CM | POA: Diagnosis not present

## 2024-01-16 DIAGNOSIS — M1711 Unilateral primary osteoarthritis, right knee: Secondary | ICD-10-CM

## 2024-01-16 HISTORY — PX: TOTAL KNEE ARTHROPLASTY: SHX125

## 2024-01-16 SURGERY — ARTHROPLASTY, KNEE, TOTAL
Anesthesia: Spinal | Site: Knee | Laterality: Right

## 2024-01-16 MED ORDER — ACETAMINOPHEN 500 MG PO TABS
1000.0000 mg | ORAL_TABLET | Freq: Four times a day (QID) | ORAL | Status: AC
  Administered 2024-01-16 – 2024-01-17 (×2): 1000 mg via ORAL
  Filled 2024-01-16 (×3): qty 2

## 2024-01-16 MED ORDER — ROSUVASTATIN CALCIUM 5 MG PO TABS
5.0000 mg | ORAL_TABLET | Freq: Every day | ORAL | Status: DC
  Administered 2024-01-17: 5 mg via ORAL
  Filled 2024-01-16: qty 1

## 2024-01-16 MED ORDER — BUPIVACAINE-EPINEPHRINE 0.25% -1:200000 IJ SOLN
INTRAMUSCULAR | Status: DC | PRN
  Administered 2024-01-16: 26 mL

## 2024-01-16 MED ORDER — MIDAZOLAM HCL 5 MG/5ML IJ SOLN
INTRAMUSCULAR | Status: DC | PRN
  Administered 2024-01-16: 2 mg via INTRAVENOUS

## 2024-01-16 MED ORDER — OXYCODONE HCL 5 MG PO TABS: 5.0000 mg | ORAL_TABLET | Freq: Once | ORAL | Status: DC | PRN

## 2024-01-16 MED ORDER — MIDAZOLAM HCL 2 MG/2ML IJ SOLN
INTRAMUSCULAR | Status: AC
  Filled 2024-01-16: qty 2

## 2024-01-16 MED ORDER — CEFAZOLIN SODIUM-DEXTROSE 2-4 GM/100ML-% IV SOLN
2.0000 g | INTRAVENOUS | Status: AC
  Administered 2024-01-16: 2 g via INTRAVENOUS
  Filled 2024-01-16: qty 100

## 2024-01-16 MED ORDER — TRANEXAMIC ACID-NACL 1000-0.7 MG/100ML-% IV SOLN
1000.0000 mg | INTRAVENOUS | Status: AC
  Administered 2024-01-16: 1000 mg via INTRAVENOUS
  Filled 2024-01-16: qty 100

## 2024-01-16 MED ORDER — CEFAZOLIN SODIUM-DEXTROSE 2-4 GM/100ML-% IV SOLN
2.0000 g | Freq: Four times a day (QID) | INTRAVENOUS | Status: AC
  Administered 2024-01-16 (×2): 2 g via INTRAVENOUS
  Filled 2024-01-16 (×2): qty 100

## 2024-01-16 MED ORDER — OXYCODONE HCL 5 MG PO TABS
10.0000 mg | ORAL_TABLET | ORAL | Status: DC | PRN
  Administered 2024-01-16: 10 mg via ORAL
  Filled 2024-01-16: qty 2

## 2024-01-16 MED ORDER — BUPIVACAINE IN DEXTROSE 0.75-8.25 % IT SOLN
INTRATHECAL | Status: DC | PRN
  Administered 2024-01-16: 2 mL via INTRATHECAL

## 2024-01-16 MED ORDER — DEXAMETHASONE SOD PHOSPHATE PF 10 MG/ML IJ SOLN
INTRAMUSCULAR | Status: DC | PRN
  Administered 2024-01-16: 8 mg via INTRAVENOUS

## 2024-01-16 MED ORDER — ADULT MULTIVITAMIN W/MINERALS CH
1.0000 | ORAL_TABLET | Freq: Every day | ORAL | Status: DC
  Administered 2024-01-16 – 2024-01-17 (×2): 1 via ORAL
  Filled 2024-01-16 (×2): qty 1

## 2024-01-16 MED ORDER — PROPOFOL 500 MG/50ML IV EMUL
INTRAVENOUS | Status: DC | PRN
  Administered 2024-01-16: 20 ug/kg/min via INTRAVENOUS

## 2024-01-16 MED ORDER — KCL IN DEXTROSE-NACL 20-5-0.9 MEQ/L-%-% IV SOLN
INTRAVENOUS | Status: AC
  Filled 2024-01-16 (×2): qty 1000

## 2024-01-16 MED ORDER — EPHEDRINE SULFATE-NACL 50-0.9 MG/10ML-% IV SOSY
PREFILLED_SYRINGE | INTRAVENOUS | Status: DC | PRN
  Administered 2024-01-16: 5 mg via INTRAVENOUS

## 2024-01-16 MED ORDER — PHENOL 1.4 % MT LIQD: 1.0000 | OROMUCOSAL | Status: DC | PRN

## 2024-01-16 MED ORDER — LACTATED RINGERS IV SOLN: INTRAVENOUS | Status: DC

## 2024-01-16 MED ORDER — PROPOFOL 1000 MG/100ML IV EMUL
INTRAVENOUS | Status: AC
  Filled 2024-01-16: qty 100

## 2024-01-16 MED ORDER — FENTANYL CITRATE (PF) 100 MCG/2ML IJ SOLN
INTRAMUSCULAR | Status: AC
  Filled 2024-01-16: qty 2

## 2024-01-16 MED ORDER — BUPIVACAINE-EPINEPHRINE (PF) 0.25% -1:200000 IJ SOLN
INTRAMUSCULAR | Status: AC
  Filled 2024-01-16: qty 30

## 2024-01-16 MED ORDER — BUPIVACAINE LIPOSOME 1.3 % IJ SUSP
INTRAMUSCULAR | Status: AC
  Filled 2024-01-16: qty 20

## 2024-01-16 MED ORDER — METHOCARBAMOL 1000 MG/10ML IJ SOLN: 500.0000 mg | Freq: Four times a day (QID) | INTRAMUSCULAR | Status: DC | PRN

## 2024-01-16 MED ORDER — ONDANSETRON HCL 4 MG/2ML IJ SOLN
INTRAMUSCULAR | Status: DC | PRN
  Administered 2024-01-16: 4 mg via INTRAVENOUS

## 2024-01-16 MED ORDER — METOCLOPRAMIDE HCL 5 MG/ML IJ SOLN: 5.0000 mg | Freq: Three times a day (TID) | INTRAMUSCULAR | Status: DC | PRN

## 2024-01-16 MED ORDER — LACTATED RINGERS IV SOLN
INTRAVENOUS | Status: DC
Start: 2024-01-16 — End: 2024-01-16

## 2024-01-16 MED ORDER — RISAQUAD PO CAPS
1.0000 | ORAL_CAPSULE | Freq: Every day | ORAL | Status: DC
  Administered 2024-01-16 – 2024-01-17 (×2): 1 via ORAL
  Filled 2024-01-16 (×2): qty 1

## 2024-01-16 MED ORDER — FERROUS SULFATE 325 (65 FE) MG PO TABS
325.0000 mg | ORAL_TABLET | Freq: Every day | ORAL | Status: DC
  Administered 2024-01-17: 325 mg via ORAL
  Filled 2024-01-16: qty 1

## 2024-01-16 MED ORDER — ASPIRIN 81 MG PO CHEW: 81.0000 mg | CHEWABLE_TABLET | Freq: Two times a day (BID) | ORAL | Status: DC

## 2024-01-16 MED ORDER — METOCLOPRAMIDE HCL 5 MG PO TABS: 5.0000 mg | ORAL_TABLET | Freq: Three times a day (TID) | ORAL | Status: DC | PRN

## 2024-01-16 MED ORDER — HYDROMORPHONE HCL 1 MG/ML IJ SOLN: 0.2500 mg | INTRAMUSCULAR | Status: DC | PRN

## 2024-01-16 MED ORDER — SODIUM CHLORIDE (PF) 0.9 % IJ SOLN
INTRAMUSCULAR | Status: DC | PRN
  Administered 2024-01-16: 60 mL

## 2024-01-16 MED ORDER — ONDANSETRON HCL 4 MG/2ML IJ SOLN: 4.0000 mg | Freq: Four times a day (QID) | INTRAMUSCULAR | Status: DC | PRN

## 2024-01-16 MED ORDER — SODIUM CHLORIDE (PF) 0.9 % IJ SOLN
INTRAMUSCULAR | Status: AC
  Filled 2024-01-16: qty 40

## 2024-01-16 MED ORDER — STERILE WATER FOR IRRIGATION IR SOLN
Status: DC | PRN
  Administered 2024-01-16: 2000 mL

## 2024-01-16 MED ORDER — DIPHENHYDRAMINE HCL 12.5 MG/5ML PO ELIX: 12.5000 mg | ORAL_SOLUTION | ORAL | Status: DC | PRN

## 2024-01-16 MED ORDER — MENTHOL 3 MG MT LOZG: 1.0000 | LOZENGE | OROMUCOSAL | Status: DC | PRN

## 2024-01-16 MED ORDER — MAGNESIUM CITRATE PO SOLN: 1.0000 | Freq: Once | ORAL | Status: DC | PRN

## 2024-01-16 MED ORDER — FENTANYL CITRATE (PF) 100 MCG/2ML IJ SOLN
INTRAMUSCULAR | Status: DC | PRN
  Administered 2024-01-16: 50 ug via INTRAVENOUS

## 2024-01-16 MED ORDER — EPHEDRINE 5 MG/ML INJ
INTRAVENOUS | Status: AC
  Filled 2024-01-16: qty 5

## 2024-01-16 MED ORDER — SODIUM CHLORIDE 0.9 % IR SOLN
Status: DC | PRN
  Administered 2024-01-16: 250 mL
  Administered 2024-01-16: 1000 mL

## 2024-01-16 MED ORDER — OXYCODONE HCL 5 MG PO TABS
5.0000 mg | ORAL_TABLET | ORAL | Status: DC | PRN
  Administered 2024-01-16: 10 mg via ORAL
  Administered 2024-01-16: 5 mg via ORAL
  Administered 2024-01-17 (×2): 10 mg via ORAL
  Filled 2024-01-16 (×2): qty 2
  Filled 2024-01-16: qty 1
  Filled 2024-01-16: qty 2

## 2024-01-16 MED ORDER — HYDROMORPHONE HCL 1 MG/ML IJ SOLN
0.5000 mg | INTRAMUSCULAR | Status: DC | PRN
  Administered 2024-01-16: 0.5 mg via INTRAVENOUS
  Administered 2024-01-16: 1 mg via INTRAVENOUS
  Filled 2024-01-16 (×2): qty 1

## 2024-01-16 MED ORDER — ONDANSETRON HCL 4 MG/2ML IJ SOLN: 4.0000 mg | Freq: Once | INTRAMUSCULAR | Status: DC | PRN

## 2024-01-16 MED ORDER — ONDANSETRON HCL 4 MG PO TABS: 4.0000 mg | ORAL_TABLET | Freq: Four times a day (QID) | ORAL | Status: DC | PRN

## 2024-01-16 MED ORDER — OXYCODONE HCL 5 MG/5ML PO SOLN: 5.0000 mg | Freq: Once | ORAL | Status: DC | PRN

## 2024-01-16 MED ORDER — CHLORHEXIDINE GLUCONATE 0.12 % MT SOLN
15.0000 mL | Freq: Once | OROMUCOSAL | Status: AC
Start: 2024-01-16 — End: 2024-01-16
  Administered 2024-01-16: 15 mL via OROMUCOSAL

## 2024-01-16 MED ORDER — 0.9 % SODIUM CHLORIDE (POUR BTL) OPTIME
TOPICAL | Status: DC | PRN
  Administered 2024-01-16: 1000 mL

## 2024-01-16 MED ORDER — POLYETHYLENE GLYCOL 3350 17 G PO PACK
17.0000 g | PACK | Freq: Every day | ORAL | Status: DC
  Administered 2024-01-17: 17 g via ORAL
  Filled 2024-01-16 (×2): qty 1

## 2024-01-16 MED ORDER — PHENYLEPHRINE HCL-NACL 20-0.9 MG/250ML-% IV SOLN
INTRAVENOUS | Status: DC | PRN
  Administered 2024-01-16: 25 ug/min via INTRAVENOUS

## 2024-01-16 MED ORDER — METHOCARBAMOL 500 MG PO TABS
500.0000 mg | ORAL_TABLET | Freq: Four times a day (QID) | ORAL | Status: DC | PRN
  Administered 2024-01-16 – 2024-01-17 (×3): 500 mg via ORAL
  Filled 2024-01-16 (×3): qty 1

## 2024-01-16 MED ORDER — DOCUSATE SODIUM 100 MG PO CAPS
100.0000 mg | ORAL_CAPSULE | Freq: Two times a day (BID) | ORAL | Status: DC
  Administered 2024-01-16 – 2024-01-17 (×3): 100 mg via ORAL
  Filled 2024-01-16 (×3): qty 1

## 2024-01-16 MED ORDER — ROPIVACAINE HCL 5 MG/ML IJ SOLN
INTRAMUSCULAR | Status: DC | PRN
  Administered 2024-01-16: 30 mL via PERINEURAL

## 2024-01-16 MED ORDER — ONDANSETRON HCL 4 MG/2ML IJ SOLN
INTRAMUSCULAR | Status: AC
Start: 2024-01-16 — End: 2024-01-16
  Filled 2024-01-16: qty 2

## 2024-01-16 MED ORDER — ALUM & MAG HYDROXIDE-SIMETH 200-200-20 MG/5ML PO SUSP: 30.0000 mL | ORAL | Status: DC | PRN

## 2024-01-16 MED ORDER — ACETAMINOPHEN 10 MG/ML IV SOLN
1000.0000 mg | INTRAVENOUS | Status: AC
  Administered 2024-01-16: 1000 mg via INTRAVENOUS
  Filled 2024-01-16: qty 100

## 2024-01-16 MED ORDER — BISACODYL 5 MG PO TBEC: 5.0000 mg | DELAYED_RELEASE_TABLET | Freq: Every day | ORAL | Status: DC | PRN

## 2024-01-16 MED ORDER — ORAL CARE MOUTH RINSE: 15.0000 mL | Freq: Once | OROMUCOSAL | Status: AC

## 2024-01-16 SURGICAL SUPPLY — 56 items
ATTUNE MED DOME PAT 38 KNEE (Knees) IMPLANT
ATTUNE PS FEM RT SZ 7 CEM KNEE (Femur) IMPLANT
ATTUNE PSRP INSR SZ7 10 KNEE (Insert) IMPLANT
BAG COUNTER SPONGE SURGICOUNT (BAG) ×1 IMPLANT
BAG ZIPLOCK 12X15 (MISCELLANEOUS) IMPLANT
BASE TIBIAL ROT PLAT SZ 8 KNEE (Knees) IMPLANT
BLADE SAW SGTL 11.0X1.19X90.0M (BLADE) ×1 IMPLANT
BLADE SAW SGTL 13.0X1.19X90.0M (BLADE) ×1 IMPLANT
BLADE SURG SZ10 CARB STEEL (BLADE) ×2 IMPLANT
BNDG ELASTIC 4INX 5YD STR LF (GAUZE/BANDAGES/DRESSINGS) ×1 IMPLANT
BNDG ELASTIC 6INX 5YD STR LF (GAUZE/BANDAGES/DRESSINGS) ×1 IMPLANT
BNDG ELASTIC 6X15 VLCR STRL LF (GAUZE/BANDAGES/DRESSINGS) IMPLANT
BOWL SMART MIX CTS (DISPOSABLE) ×1 IMPLANT
CEMENT HV SMART SET (Cement) ×2 IMPLANT
COVER SURGICAL LIGHT HANDLE (MISCELLANEOUS) ×1 IMPLANT
CUFF TRNQT CYL 34X4.125X (TOURNIQUET CUFF) ×1 IMPLANT
DRAPE INCISE IOBAN 66X45 STRL (DRAPES) IMPLANT
DRAPE SHEET LG 3/4 BI-LAMINATE (DRAPES) ×1 IMPLANT
DRAPE SURG ORHT 6 SPLT 77X108 (DRAPES) ×2 IMPLANT
DRAPE TOP 10253 STERILE (DRAPES) ×1 IMPLANT
DRAPE U-SHAPE 47X51 STRL (DRAPES) ×1 IMPLANT
DRSG AQUACEL AG ADV 3.5X10 (GAUZE/BANDAGES/DRESSINGS) ×1 IMPLANT
DURAPREP 26ML APPLICATOR (WOUND CARE) ×1 IMPLANT
ELECT BLADE TIP CTD 4 INCH (ELECTRODE) ×1 IMPLANT
ELECT PENCIL ROCKER SW 15FT (MISCELLANEOUS) ×1 IMPLANT
ELECT REM PT RETURN 15FT ADLT (MISCELLANEOUS) ×1 IMPLANT
GLOVE BIOGEL PI IND STRL 7.0 (GLOVE) ×1 IMPLANT
GLOVE BIOGEL PI IND STRL 8 (GLOVE) ×1 IMPLANT
GLOVE SURG SS PI 7.0 STRL IVOR (GLOVE) ×1 IMPLANT
GLOVE SURG SS PI 8.0 STRL IVOR (GLOVE) ×2 IMPLANT
GOWN STRL REUS W/ TWL XL LVL3 (GOWN DISPOSABLE) ×2 IMPLANT
HEMOSTAT SPONGE AVITENE ULTRA (HEMOSTASIS) IMPLANT
HOLDER FOLEY CATH W/STRAP (MISCELLANEOUS) ×1 IMPLANT
IMMOBILIZER KNEE 20 THIGH 36 (SOFTGOODS) ×1 IMPLANT
KIT TURNOVER KIT A (KITS) ×1 IMPLANT
MANIFOLD NEPTUNE II (INSTRUMENTS) ×1 IMPLANT
NS IRRIG 1000ML POUR BTL (IV SOLUTION) IMPLANT
PACK TOTAL KNEE CUSTOM (KITS) ×1 IMPLANT
PIN STEINMAN FIXATION KNEE (PIN) IMPLANT
PROTECTOR NERVE ULNAR (MISCELLANEOUS) ×1 IMPLANT
SEALER BIPOLAR AQUA 6.0 (INSTRUMENTS) ×1 IMPLANT
SET HNDPC FAN SPRY TIP SCT (DISPOSABLE) ×1 IMPLANT
SOLUTION PRONTOSAN WOUND 350ML (IRRIGATION / IRRIGATOR) ×1 IMPLANT
SPIKE FLUID TRANSFER (MISCELLANEOUS) ×1 IMPLANT
STAPLER VISISTAT (STAPLE) IMPLANT
STRIP CLOSURE SKIN 1/2X4 (GAUZE/BANDAGES/DRESSINGS) IMPLANT
SUT BONE WAX W31G (SUTURE) IMPLANT
SUT MNCRL AB 4-0 PS2 18 (SUTURE) IMPLANT
SUT STRATAFIX PDS+ 0 24IN (SUTURE) ×1 IMPLANT
SUT VIC AB 1 CT1 27XBRD ANTBC (SUTURE) ×3 IMPLANT
SUT VIC AB 2-0 CT1 TAPERPNT 27 (SUTURE) ×3 IMPLANT
TRAY FOLEY MTR SLVR 16FR STAT (SET/KITS/TRAYS/PACK) ×1 IMPLANT
TUBE SUCTION HIGH CAP CLEAR NV (SUCTIONS) ×1 IMPLANT
WATER STERILE IRR 1000ML POUR (IV SOLUTION) ×2 IMPLANT
WIPE CHG 2% PREP (PERSONAL CARE ITEMS) ×1 IMPLANT
WRAP KNEE MAXI GEL POST OP (GAUZE/BANDAGES/DRESSINGS) ×1 IMPLANT

## 2024-01-16 NOTE — Discharge Instructions (Signed)

## 2024-01-16 NOTE — Op Note (Signed)
 NAME: Patrick Fisher, Patrick Fisher MEDICAL RECORD NO: [id number] ACCOUNT NO: [id number] DATE OF BIRTH: 23-Apr-1955 FACILITY: [facility] LOCATION: [facility] PHYSICIAN: Reyes KYM Billing, MD  Operative Report   DATE OF PROCEDURE: 01/16/2024  PREOPERATIVE DIAGNOSIS:  End-stage arthrosis, varus deformity, right knee.  POSTOPERATIVE DIAGNOSIS:  End-stage arthrosis, varus deformity, right knee.  PROCEDURE PERFORMED:  Right total knee arthroplasty utilizing an Attune rotating platform, 7 femur, 8 tibia, 10 mm insert, 38 patella.  ANESTHESIA:  Spinal.  ASSISTANT:  Arlyne Randy, PA.  HISTORY:  A 68 year old, end-stage arthrosis, varus deformity, bone-on-bone, indicated for replacement of the degenerated joint failing conservative treatment.  Risks and benefits were discussed including bleeding, infection, damage to neurovascular  structures, no change in symptoms, worsening symptoms, DVT, PE, anesthetic complications, etc.  DESCRIPTION OF PROCEDURE:  The patient supine position after induction of adequate spinal anesthesia and 2 grams of Kefzol  the right lower extremity was prepped draped and exsanguinating in usual sterile fashion.  Thigh tourniquet inflated to 225 mmHg.   Midline incision was then made over the knee, full thickness flaps developed.  Medial parapatellar arthrotomy was then performed.  Patella was gently everted, knee was flexed, tri-compartmental osteoarthrosis was noted.  Elevated soft tissues medially  preserving the MCL.  Remnants of medial and lateral menisci were removed.  Bone on bone was noted particularly the medial compartment.  A notch was placed above the femoral notch a starting hole for the femoral drill, which was drilled in line with the  femur entering without difficulty.  Confirmed with a T-handle, prior to that it was irrigated, used an intramedullary guide 5 degree right with 9 off the distal femur, which was pinned and performed a distal femoral cut sized it then off the anterior   cortex to a 7, 3 degrees of external rotation this is pinned placed our block performed the anterior posterior and chamfer cuts protecting soft tissues posteriorly at all times subluxed the tibia, low side was medially.  External alignment guide was used  2 off the defect medially, which is 9 off the defect laterally.  Parallel to the shaft bisecting the tibiotalar joint 3 degree slope.  Pinned and performed our tibial cut.  I then sized the extension gap first to an 8 and then to a 10.  Then reflexed  the knee, subluxed the tibia, sized it to an 8 just to the medial third of the tibial tubercle maximizing the coverage.  This was then pinned, drilled centrally, harvested bone and impacted into the distal femur, used our punch guide.  Osteophytes  removed medially.  Then turned attention to the femur used our box cutting guide bisecting the canal and the condyles.  This was pinned and performed a box cut then placed a trial femur, which fit flush, drilled our lug holes.  Placed a 10 mm insert and  reduced it, had full extension, full flexion, good stability, varus valgus stress at 0 and 30 degrees, negative anterior drawer, everted the patella, measured it to a 26 planed it to a 16.5.  Utilizing a patellar jig.  And then a trial patella 38  parallel to the joint surface drilled in our peg holes a trial patella was placed and reduced and I had excellent patellofemoral tracking.  All instrumentation was then removed, checked posteriorly, cauterized the geniculates.  Capsule and popliteus was  intact.  We injected with Exparel  through the posterior medial capsule aspirating first without a break in the vacuum and injecting 20 mL.  Anesthetized the medial lateral  gutters.  Periosteum of the femur, quadriceps tendon, proximal tibia, etc.  Then used pulse lavage to thoroughly clean all surfaces, mixed cement in the back table under vacuum and centrifuged.  Knee was flexed.  All surfaces thoroughly dried, drilled  peg holes in the eburnated bone medially.  Placed the cement in the proximal  tibia digitally pressurizing it.  Cement was placed in the tibial tray and then impacted into place, redundant cement removed.  Cement was placed on the femur and the femoral component, impacted into place with redundant cement removed a trial 10 insert  was placed.  It was reduced and held in extension with an axial load throughout the curing of the cement, redundant cement removed.  Marcaine  with epinephrine  followed by Prontosan was placed in the wound, which was covered during the curing of the  cement.  The patella was cemented and clamped as well.  Tourniquet was deflated at 65 minutes.  Any bleeding was cauterized with the Aquamantys.  Due to preoperative anemia.  I had full extension, full flexion, good stability of varus valgus stress at 0 and 30 degrees, negative anterior drawer, 10 mm insert was removed and meticulously removed all redundant cement, copiously irrigated with pulsatile lavage followed by  Prontosan, placed a permanent 10 mm insert.  I then reduced it.  Full extension, full flexion good stability of varus valgus stress at 0 and 30 degrees.  Negative anterior drawer.  In mid-flexion reapproximated the patellar arthrotomy with 1 Vicryl  interrupted figure-of-eight sutures followed by a running Stratafix following this with a lateral release we had excellent patellofemoral tracking.  Copiously irrigated subcutaneous tissue then with Prontosan subcu with 2-0 and skin with staples.  Wound  was dressed sterilely, gained flexion to gravity at 90 degrees.  Immobilizer was placed and transported to the recovery room in satisfactory condition.  Patient tolerated procedure with no complications.  Assistant Arlyne Randy, [place] was used throughout the case patient positioning, gentle intermittent traction, exposure and closure.  BLOOD LOSS:  50 mL.   PUS D: 01/16/2024 9:53:07 am T: 01/16/2024 10:22:00 am   JOB: [id number]/ [id number]

## 2024-01-16 NOTE — Interval H&P Note (Signed)
 History and Physical Interval Note:  01/16/2024 7:01 AM  Patrick Fisher  has presented today for surgery, with the diagnosis of degenerative joint disease right knee.  The various methods of treatment have been discussed with the patient and family. After consideration of risks, benefits and other options for treatment, the patient has consented to  Procedure(s): ARTHROPLASTY, KNEE, TOTAL (Right) as a surgical intervention.  The patient's history has been reviewed, patient examined, no change in status, stable for surgery.  I have reviewed the patient's chart and labs.  Questions were answered to the patient's satisfaction.     Reyes [name]

## 2024-01-16 NOTE — Transfer of Care (Signed)
 Immediate Anesthesia Transfer of Care Note  Patient: Patrick Fisher  Procedure(s) Performed: ARTHROPLASTY, KNEE, TOTAL (Right: Knee)  Patient Location: PACU  Anesthesia Type:MAC and Spinal  Level of Consciousness: awake, alert , oriented, and patient cooperative  Airway & Oxygen Therapy: Patient Spontanous Breathing and Patient connected to face mask oxygen  Post-op Assessment: Report given to RN and Post -op Vital signs reviewed and stable  Post vital signs: Reviewed and stable  Last Vitals:  Vitals Value Taken Time  BP 127/80 01/16/24 10:01  Temp    Pulse 57 01/16/24 10:04  Resp 16 01/16/24 10:04  SpO2 99 % 01/16/24 10:04  Vitals shown include unfiled device data.  Last Pain:  Vitals:   01/16/24 9367  TempSrc: Oral  PainSc:          Complications: No notable events documented.

## 2024-01-16 NOTE — Anesthesia Procedure Notes (Signed)
 Procedure Name: MAC Date/Time: 01/16/2024 7:44 AM  Performed by: Memory Armida LABOR, CRNAPre-anesthesia Checklist: Patient identified, Emergency Drugs available, Suction available, Patient being monitored and Timeout performed Patient Re-evaluated:Patient Re-evaluated prior to induction Oxygen Delivery Method: Simple face mask Placement Confirmation: positive ETCO2 Dental Injury: Teeth and Oropharynx as per pre-operative assessment

## 2024-01-16 NOTE — Evaluation (Signed)
 Physical Therapy Evaluation Patient Details Name: Patrick Fisher MRN: [id number] DOB: [birth year] Today's Date: 01/16/2024  History of Present Illness  68 yo male presents to therapy s/p R TKA on 01/16/2024 due to failure of conservative measures. Pt PMH includes but is not limited to: arthritis.  Clinical Impression    Patrick Fisher is a 68 y.o. male POD 0 s/p R TKA. Patient reports IND with mobility at baseline. Patient is now limited by functional impairments (see PT problem list below) and requires S for bed mobility and CGA for transfers. Patient was able to ambulate 55 feet with RW and CGA level of assist. Patient instructed in exercise to facilitate ROM and circulation to manage edema. Patient will benefit from continued skilled PT interventions to address impairments and progress towards PLOF. Acute PT will follow to progress mobility and stair training in preparation for safe discharge home with family support and [facility] services.       If plan is discharge home, recommend the following: A little help with walking and/or transfers;A little help with bathing/dressing/bathroom;Assistance with cooking/housework;Assist for transportation;Help with stairs or ramp for entrance   Can travel by private vehicle        Equipment Recommendations Rolling walker (2 wheels)  Recommendations for Other Services       Functional Status Assessment Patient has had a recent decline in their functional status and demonstrates the ability to make significant improvements in function in a reasonable and predictable amount of time.     Precautions / Restrictions Precautions Precautions: Knee;Fall Restrictions Weight Bearing Restrictions Per Provider Order: Yes RLE Weight Bearing Per Provider Order: Weight bearing as tolerated      Mobility  Bed Mobility Overal bed mobility: Needs Assistance Bed Mobility: Supine to Sit     Supine to sit: Supervision     General bed mobility comments: min cues     Transfers Overall transfer level: Needs assistance Equipment used: Rolling walker (2 wheels) Transfers: Sit to/from Stand Sit to Stand: Contact guard assist           General transfer comment: min cues push to stand    Ambulation/Gait Ambulation/Gait assistance: Contact guard assist Gait Distance (Feet): 55 Feet Assistive device: Rolling walker (2 wheels) Gait Pattern/deviations: Step-to pattern, Antalgic, Trunk flexed, Decreased stance time - right Gait velocity: decreased     General Gait Details: slight trunk flexion with B UE support at RW to offload R LE in stance phase, pt required min cues for RW management  Stairs            Wheelchair Mobility     Tilt Bed    Modified Rankin (Stroke Patients Only)       Balance Overall balance assessment: Needs assistance Sitting-balance support: Feet supported Sitting balance-Leahy Scale: Good     Standing balance support: Bilateral upper extremity supported, During functional activity, Reliant on assistive device for balance Standing balance-Leahy Scale: Fair Standing balance comment: static standing no UE support                             Pertinent Vitals/Pain Pain Assessment Pain Assessment: 0-10 Pain Score: 6  Pain Location: R LE and knee Pain Descriptors / Indicators: Aching, Constant, Discomfort, Grimacing, Dull, Operative site guarding Pain Intervention(s): Limited activity within patient's tolerance, Monitored during session, Premedicated before session, Repositioned, Ice applied, Patient requesting pain meds-RN notified    Home Living Family/patient expects to be discharged to:: Private residence  Living Arrangements: Spouse/significant other Available Help at Discharge: Family Type of Home: House Home Access: Stairs to enter   [employer] of Steps: 1 no handrail + 2 inside with a grab bar Alternate Level Stairs-Number of Steps: flight Home Layout: Two level;Bed/bath  upstairs Home Equipment: Cane - single point (belonged to his mother)      Prior Function Prior Level of Function : Independent/Modified Independent;Driving;Working/employed (working part time)             Mobility Comments: IND no AD for all ADLs self care tasks and IADLs       Extremity/Trunk Assessment        Lower Extremity Assessment Lower Extremity Assessment: RLE deficits/detail RLE Deficits / Details: ankle DF/PF 5/5; SLR < 10 degree lag RLE Sensation: WNL    Cervical / Trunk Assessment Cervical / Trunk Assessment: Normal  Communication   Communication Communication: No apparent difficulties    Cognition Arousal: Alert Behavior During Therapy: WFL for tasks assessed/performed   PT - Cognitive impairments: No apparent impairments                         Following commands: Intact       Cueing       General Comments      Exercises Total Joint Exercises Ankle Circles/Pumps: AROM, Both, 10 reps   Assessment/Plan    PT Assessment Patient needs continued PT services  PT Problem List Decreased strength;Decreased range of motion;Decreased activity tolerance;Decreased balance;Decreased mobility;Decreased coordination;Pain       PT Treatment Interventions DME instruction;Gait training;Stair training;Functional mobility training;Therapeutic activities;Therapeutic exercise;Neuromuscular re-education;Balance training;Patient/family education;Modalities    PT Goals (Current goals can be found in the Care Plan section)  Acute Rehab PT Goals Patient Stated Goal: to be able to get back to hiking, boating, traveling and have the L knee done PT Goal Formulation: With patient Time For Goal Achievement: 01/30/24 Potential to Achieve Goals: Good    Frequency 7X/week     Co-evaluation               AM-PAC PT 6 Clicks Mobility  Outcome Measure Help needed turning from your back to your side while in a flat bed without using bedrails?:  None Help needed moving from lying on your back to sitting on the side of a flat bed without using bedrails?: A Little Help needed moving to and from a bed to a chair (including a wheelchair)?: A Little Help needed standing up from a chair using your arms (e.g., wheelchair or bedside chair)?: A Little Help needed to walk in hospital room?: A Little Help needed climbing 3-5 steps with a railing? : A Lot 6 Click Score: 18    End of Session Equipment Utilized During Treatment: Gait belt Activity Tolerance: Patient tolerated treatment well Patient left: in chair;with call bell/phone within reach;with chair alarm set Nurse Communication: Mobility status;Patient requests pain meds PT Visit Diagnosis: Unsteadiness on feet (R26.81);Other abnormalities of gait and mobility (R26.89);Muscle weakness (generalized) (M62.81);Difficulty in walking, not elsewhere classified (R26.2);Pain Pain - Right/Left: Right Pain - part of body: Knee;Leg    Time: 1745-1811 PT Time Calculation (min) (ACUTE ONLY): 26 min   Charges:   PT Evaluation $PT Eval Low Complexity: 1 Low PT Treatments $Gait Training: 8-22 mins PT General Charges $$ ACUTE PT VISIT: 1 Visit         [place], PT Acute Rehab   [place] VEAR Drone 01/16/2024, 6:32 PM

## 2024-01-16 NOTE — Anesthesia Procedure Notes (Addendum)
 Anesthesia Regional Block: Adductor canal block   Pre-Anesthetic Checklist: , timeout performed,  Correct Patient, Correct Site, Correct Laterality,  Correct Procedure, Correct Position, site marked,  Risks and benefits discussed,  Surgical consent,  Pre-op evaluation,  At surgeon's request and post-op pain management  Laterality: Right  Prep: chloraprep       Needles:  Injection technique: Single-shot  Needle Type: Echogenic Stimulator Needle     Needle Length: 10cm  Needle Gauge: 21   Needle insertion depth: 7 cm   Additional Needles:   Procedures:,,,, ultrasound used (permanent image in chart),,    Narrative:  Start time: 01/16/2024 7:29 AM End time: 01/16/2024 7:34 AM Injection made incrementally with aspirations every 5 mL.  Performed by: Personally  Anesthesiologist: Jerrye Sharper, MD  Additional Notes: Timeout performed. Patient sedated. Relevant anatomy ID'd using US . Incremental 2-5ml injection of LA with frequent aspiration. Patient tolerated procedure well.

## 2024-01-16 NOTE — Anesthesia Postprocedure Evaluation (Signed)
 Anesthesia Post Note  Patient: [name]  Procedure(s) Performed: ARTHROPLASTY, KNEE, TOTAL (Right: Knee)     Patient location during evaluation: PACU Anesthesia Type: Spinal Level of consciousness: oriented and awake and alert Pain management: pain level controlled Vital Signs Assessment: post-procedure vital signs reviewed and stable Respiratory status: spontaneous breathing, respiratory function stable and nonlabored ventilation Cardiovascular status: blood pressure returned to baseline and stable Postop Assessment: no headache, no backache, no apparent nausea or vomiting, spinal receding and patient able to bend at knees Anesthetic complications: no   No notable events documented.  Last Vitals:  Vitals:   01/16/24 1115 01/16/24 1130  BP: 133/81 138/85  Pulse: (!) 48 (!) 42  Resp: 13 13  Temp:    SpO2: 97% 97%    Last Pain:  Vitals:   01/16/24 1130  TempSrc:   PainSc: 0-No pain                 Mayvis Agudelo A.

## 2024-01-16 NOTE — Anesthesia Procedure Notes (Signed)
 Spinal  Patient location during procedure: OR Start time: 01/16/2024 7:47 AM End time: 01/16/2024 7:50 AM Reason for block: surgical anesthesia Staffing Anesthesiologist: Mallory Manus, MD Performed by: Mallory Manus, MD Authorized by: Jerrye Sharper, MD   Preanesthetic Checklist Completed: patient identified, IV checked, site marked, risks and benefits discussed, surgical consent, monitors and equipment checked, pre-op evaluation and timeout performed Spinal Block Patient position: sitting Prep: DuraPrep Patient monitoring: heart rate, cardiac monitor, continuous pulse ox and blood pressure Approach: midline Location: L4-5 Injection technique: single-shot Needle Needle type: Sprotte  Needle gauge: 24 G Needle length: 9 cm Assessment Sensory level: T4 Events: CSF return

## 2024-01-16 NOTE — Brief Op Note (Addendum)
 01/16/2024  7:01 AM  PATIENT:  Patrick Fisher  68 y.o. male  PRE-OPERATIVE DIAGNOSIS:  degenerative joint disease right knee  POST-OPERATIVE DIAGNOSIS:  Same The aquamantis was utilized for this case to help facilitate better hemostasis as patient was felt to be at increased risk of bleeding because of preop anemia.  SABRAtom The aquamantis was utilized for this case to help facilitate better hemostasis as patient was felt to be at increased risk of bleeding because of preop anemia.   PROCEDURE:  Procedure(s): ARTHROPLASTY, KNEE, TOTAL (Right)  SURGEON:  Surgeons and Role:    * Duwayne Purchase, MD - Primary  PHYSICIAN ASSISTANT:   ASSISTANTS: Bissell   ANESTHESIA:   general  EBL:  50   BLOOD ADMINISTERED:none  DRAINS: none   LOCAL MEDICATIONS USED:  MARCAINE      SPECIMEN:  No Specimen  DISPOSITION OF SPECIMEN:  N/A  COUNTS:  YES  TOURNIQUET:    DICTATION: .Other Dictation: Dictation Number [id number]  PLAN OF CARE: Admit for overnight observation  PATIENT DISPOSITION:  PACU - hemodynamically stable.   Delay start of Pharmacological VTE agent (>24hrs) due to surgical blood loss or risk of bleeding: no

## 2024-01-16 NOTE — Plan of Care (Signed)
  Problem: Activity: Goal: Risk for activity intolerance will decrease Outcome: Progressing   Problem: Nutrition: Goal: Adequate nutrition will be maintained Outcome: Progressing   Problem: Coping: Goal: Level of anxiety will decrease Outcome: Progressing   Problem: Pain Managment: Goal: General experience of comfort will improve and/or be controlled Outcome: Progressing   Problem: Safety: Goal: Ability to remain free from injury will improve Outcome: Progressing

## 2024-01-17 DIAGNOSIS — M1711 Unilateral primary osteoarthritis, right knee: Secondary | ICD-10-CM | POA: Diagnosis not present

## 2024-01-17 LAB — CBC
HCT: 30.6 % — ABNORMAL LOW (ref 39.0–52.0)
Hemoglobin: 10 g/dL — ABNORMAL LOW (ref 13.0–17.0)
MCH: 30.3 pg (ref 26.0–34.0)
MCHC: 32.7 g/dL (ref 30.0–36.0)
MCV: 92.7 fL (ref 80.0–100.0)
Platelets: 244 K/uL (ref 150–400)
RBC: 3.3 MIL/uL — ABNORMAL LOW (ref 4.22–5.81)
RDW: 13.1 % (ref 11.5–15.5)
WBC: 12.7 K/uL — ABNORMAL HIGH (ref 4.0–10.5)
nRBC: 0 % (ref 0.0–0.2)

## 2024-01-17 LAB — BASIC METABOLIC PANEL WITH GFR
Anion gap: 8 (ref 5–15)
BUN: 19 mg/dL (ref 8–23)
CO2: 25 mmol/L (ref 22–32)
Calcium: 8.6 mg/dL — ABNORMAL LOW (ref 8.9–10.3)
Chloride: 102 mmol/L (ref 98–111)
Creatinine, Ser: 0.72 mg/dL (ref 0.61–1.24)
GFR, Estimated: >60 mL/min (ref >60)
Glucose, Bld: 166 mg/dL — ABNORMAL HIGH (ref 70–99)
Potassium: 4.5 mmol/L (ref 3.5–5.1)
Sodium: 136 mmol/L (ref 135–145)

## 2024-01-17 MED ORDER — OXYCODONE HCL 5 MG PO TABS: 5.0000 mg | ORAL_TABLET | ORAL | 0 refills | Status: AC | PRN

## 2024-01-17 MED ORDER — ASPIRIN 81 MG PO TBEC: 81.0000 mg | DELAYED_RELEASE_TABLET | Freq: Two times a day (BID) | ORAL | 1 refills | Status: AC

## 2024-01-17 MED ORDER — DOCUSATE SODIUM 100 MG PO CAPS: 100.0000 mg | ORAL_CAPSULE | Freq: Two times a day (BID) | ORAL | 2 refills | Status: AC

## 2024-01-17 MED ORDER — POLYETHYLENE GLYCOL 3350 17 G PO PACK: 17.0000 g | PACK | Freq: Every day | ORAL | 0 refills | Status: AC

## 2024-01-17 NOTE — Progress Notes (Signed)
 Physical Therapy Treatment Patient Details Name: Patrick Fisher MRN: [id number] DOB: February 14, 1956 Today's Date: 01/17/2024   History of Present Illness 68 yo male presents to therapy s/p R TKA on 01/16/2024 due to failure of conservative measures. Pt PMH includes but is not limited to: arthritis.    PT Comments  POD # 1 pm session with Spouse for Family Training. Pt was OOB in recliner.  Had Spouse hands on assist Pt with all transfers and mobility.  General stair comments: First practiced ONE step no rails forward with walker then 2 steps NO rails with walker at 75% VC's on proper tech.  Had Spouse  hands on assist Pt using safety belt. Then returned to room to perform some TE's following HEP handout.  Instructed on proper tech, freq as well as use of ICE.   Addressed all mobility questions, discussed appropriate activity, educated on use of ICE.  Pt ready for D/C to home. Walker was delivered during session.  Spouse asking about HH.   Pt has met his mobility goals to D/C to home.     If plan is discharge home, recommend the following: A little help with walking and/or transfers;A little help with bathing/dressing/bathroom;Assistance with cooking/housework;Assist for transportation;Help with stairs or ramp for entrance   Can travel by private vehicle        Equipment Recommendations  Rolling walker (2 wheels)    Recommendations for Other Services       Precautions / Restrictions Precautions Precaution/Restrictions Comments: no pillow under knee Restrictions Weight Bearing Restrictions Per Provider Order: No RLE Weight Bearing Per Provider Order: Weight bearing as tolerated     Mobility  Bed Mobility General bed mobility comments: OOB in recliner    Transfers Overall transfer level: Needs assistance Equipment used: Rolling walker (2 wheels) Transfers: Sit to/from Stand Sit to Stand: Contact guard assist, Supervision           General transfer comment: Min VC's on  proper tech and hand placement as well as safety with turns    Ambulation/Gait Ambulation/Gait assistance: Supervision, Contact guard assist Gait Distance (Feet): 28 Feet Assistive device: Rolling walker (2 wheels) Gait Pattern/deviations: Step-to pattern, Antalgic, Trunk flexed, Decreased stance time - right Gait velocity: decreased     General Gait Details: tolerated a functional distance with min VC's on proper sequencing as well as walker to self distance.   Stairs Stairs: Yes Stairs assistance: Min assist Stair Management: No rails, Step to pattern, Forwards, With walker Number of Stairs: 3 General stair comments: First practiced ONE step no rails forward with walker then 2 steps NO rails with walker at 75% VC's on proper tech.  Had Spouse  hands on assist Pt using safety belt.   Wheelchair Mobility     Tilt Bed    Modified Rankin (Stroke Patients Only)       Balance                                            Communication Communication Communication: No apparent difficulties  Cognition Arousal: Alert     PT - Cognitive impairments: No apparent impairments                       PT - Cognition Comments: AxO x 3 pleasant and motivated.  A little groggy poor sleep last night and pain meds but functional.  HOH. Following commands: Intact      Cueing Cueing Techniques: Verbal cues  Exercises  Total Knee Replacement TE's following HEP handout 10 reps B LE ankle pumps 05 reps towel squeezes 05 reps knee presses 05 reps heel slides  05 reps SAQ's 05 reps SLR's 05 reps ABD Educated on use of gait belt to assist with TE's Followed by ICE     General Comments        Pertinent Vitals/Pain Pain Assessment Pain Assessment: 0-10 Pain Score: 7  Pain Location: R knee Pain Descriptors / Indicators: Sore, Tender, Operative site guarding Pain Intervention(s): Monitored during session, Premedicated before session, Repositioned,  Ice applied    Home Living                          Prior Function            PT Goals (current goals can now be found in the care plan section) Progress towards PT goals: Progressing toward goals    Frequency    7X/week      PT Plan      Co-evaluation              AM-PAC PT 6 Clicks Mobility   Outcome Measure  Help needed turning from your back to your side while in a flat bed without using bedrails?: None Help needed moving from lying on your back to sitting on the side of a flat bed without using bedrails?: None Help needed moving to and from a bed to a chair (including a wheelchair)?: A Little Help needed standing up from a chair using your arms (e.g., wheelchair or bedside chair)?: A Little Help needed to walk in hospital room?: A Little Help needed climbing 3-5 steps with a railing? : A Little 6 Click Score: 20    End of Session Equipment Utilized During Treatment: Gait belt Activity Tolerance: Patient tolerated treatment well Patient left: in chair;with call bell/phone within reach;with chair alarm set Nurse Communication: Mobility status PT Visit Diagnosis: Unsteadiness on feet (R26.81);Other abnormalities of gait and mobility (R26.89);Muscle weakness (generalized) (M62.81);Difficulty in walking, not elsewhere classified (R26.2);Pain Pain - Right/Left: Right Pain - part of body: Knee;Leg     Time: 8867-8785 PT Time Calculation (min) (ACUTE ONLY): 42 min  Charges:    $Gait Training: 8-22 mins $Therapeutic Exercise: 8-22 mins $Therapeutic Activity: 8-22 mins PT General Charges $$ ACUTE PT VISIT: 1 Visit                    Katheryn Leap  PTA Acute  Rehabilitation Services Office M-F          [phone]

## 2024-01-17 NOTE — Progress Notes (Signed)
 Physical Therapy Treatment Patient Details Name: Patrick Fisher MRN: [id number] DOB: Jun 08, 1955 Today's Date: 01/17/2024   History of Present Illness 68 yo male presents to therapy s/p R TKA on 01/16/2024 due to failure of conservative measures. Pt PMH includes but is not limited to: arthritis.    PT Comments  POD # 1 am session  PT - Cognition Comments: AxO x 3 pleasant and motivated.  A little groggy poor sleep last night and pain meds but functional.  HOH. Assisted OOB to amb in hallway and practice stairs.  General stair comments: First practiced ONE step no rails forward with walker then 2 steps NO rails with walker at 75% VC's on proper tech.  Will need to repeat stairs when Spouse arrives.    If plan is discharge home, recommend the following: A little help with walking and/or transfers;A little help with bathing/dressing/bathroom;Assistance with cooking/housework;Assist for transportation;Help with stairs or ramp for entrance   Can travel by private vehicle        Equipment Recommendations  Rolling walker (2 wheels)    Recommendations for Other Services       Precautions / Restrictions Precautions Precaution/Restrictions Comments: no pillow under knee Restrictions Weight Bearing Restrictions Per Provider Order: No RLE Weight Bearing Per Provider Order: Weight bearing as tolerated     Mobility  Bed Mobility Overal bed mobility: Needs Assistance Bed Mobility: Supine to Sit     Supine to sit: Supervision     General bed mobility comments: self able using strap to guide LE    Transfers Overall transfer level: Needs assistance Equipment used: Rolling walker (2 wheels) Transfers: Sit to/from Stand Sit to Stand: Contact guard assist, Supervision           General transfer comment: Min VC's on proper tech and hand placement as well as safety with turns    Ambulation/Gait Ambulation/Gait assistance: Supervision, Contact guard assist Gait Distance (Feet): 42  Feet Assistive device: Rolling walker (2 wheels) Gait Pattern/deviations: Step-to pattern, Antalgic, Trunk flexed, Decreased stance time - right Gait velocity: decreased     General Gait Details: tolerated a functional distance with min VC's on proper sequencing as well as walker to self distance.   Stairs Stairs: Yes Stairs assistance: Min assist Stair Management: No rails, Step to pattern, Forwards, With walker Number of Stairs: 3 General stair comments: First practiced ONE step no rails forward with walker then 2 steps NO rails with walker at 75% VC's on proper tech.  Will need to repeat stairs when Spouse arrives.   Wheelchair Mobility     Tilt Bed    Modified Rankin (Stroke Patients Only)       Balance                                            Communication Communication Communication: No apparent difficulties  Cognition Arousal: Alert     PT - Cognitive impairments: No apparent impairments                       PT - Cognition Comments: AxO x 3 pleasant and motivated.  A little groggy poor sleep last night and pain meds but functional.  HOH. Following commands: Intact      Cueing Cueing Techniques: Verbal cues  Exercises      General Comments        Pertinent  Vitals/Pain Pain Assessment Pain Assessment: 0-10 Pain Score: 7  Pain Location: R knee Pain Descriptors / Indicators: Sore, Tender, Operative site guarding Pain Intervention(s): Monitored during session, Premedicated before session, Repositioned, Ice applied    Home Living                          Prior Function            PT Goals (current goals can now be found in the care plan section) Progress towards PT goals: Progressing toward goals    Frequency    7X/week      PT Plan      Co-evaluation              AM-PAC PT 6 Clicks Mobility   Outcome Measure  Help needed turning from your back to your side while in a flat bed  without using bedrails?: None Help needed moving from lying on your back to sitting on the side of a flat bed without using bedrails?: None Help needed moving to and from a bed to a chair (including a wheelchair)?: A Little Help needed standing up from a chair using your arms (e.g., wheelchair or bedside chair)?: A Little Help needed to walk in hospital room?: A Little Help needed climbing 3-5 steps with a railing? : A Little 6 Click Score: 20    End of Session Equipment Utilized During Treatment: Gait belt Activity Tolerance: Patient tolerated treatment well Patient left: in chair;with call bell/phone within reach;with chair alarm set Nurse Communication: Mobility status PT Visit Diagnosis: Unsteadiness on feet (R26.81);Other abnormalities of gait and mobility (R26.89);Muscle weakness (generalized) (M62.81);Difficulty in walking, not elsewhere classified (R26.2);Pain Pain - Right/Left: Right Pain - part of body: Knee;Leg     Time: 0927-0958 PT Time Calculation (min) (ACUTE ONLY): 31 min  Charges:    $Gait Training: 8-22 mins $Therapeutic Activity: 8-22 mins PT General Charges $$ ACUTE PT VISIT: 1 Visit                     Katheryn Leap  PTA Acute  Rehabilitation Services Office M-F          [phone]

## 2024-01-17 NOTE — TOC Transition Note (Signed)
 Transition of Care [facility]) - Discharge Note   Patient Details  Name: Patrick Fisher MRN: [id number] Date of Birth: Oct 07, 1955  [facility]) CM/SW Contact:  Bascom Service, RN Phone Number: 01/17/2024, 12:51 PM   Clinical Narrative: [facility] [address] [place], [place], [address] [phone]   They are open all day           Patient Goals and CMS Choice            Discharge Placement                       Discharge Plan and Services Additional resources added to the After Visit Summary for                                       Social Drivers of Health (SDOH) Interventions SDOH Screenings   Food Insecurity: No Food Insecurity (01/16/2024)  Housing: Low Risk  (01/16/2024)  Transportation Needs: No Transportation Needs (01/16/2024)  Utilities: Not At Risk (01/16/2024)  Social Connections: Moderately Isolated (01/16/2024)  Tobacco Use: Low Risk  (01/16/2024)     Readmission Risk Interventions     No data to display

## 2024-01-17 NOTE — Progress Notes (Signed)
   Subjective: 1 Day Post-Op Procedure(s) (LRB): ARTHROPLASTY, KNEE, TOTAL (Right) Patient reports pain as mild.   Patient seen in rounds for Dr. Duwayne. Patient is well, and has had no acute complaints or problems. Pain well controlled currently. No issues overnight.  We will continue therapy today.  Objective: Vital signs in last 24 hours: Temp:  [96.3 F (35.7 C)-98.5 F (36.9 C)] 98.4 F (36.9 C) (11/27 0556) Pulse Rate:  [42-75] 74 (11/27 0556) Resp:  [11-18] 16 (11/27 0556) BP: (114-140)/(69-88) 120/69 (11/27 0556) SpO2:  [97 %-100 %] 97 % (11/27 0556)  Intake/Output from previous day:  Intake/Output Summary (Last 24 hours) at 01/17/2024 0741 Last data filed at 01/17/2024 0606 Gross per 24 hour  Intake 4137.99 ml  Output 4200 ml  Net -62.01 ml     Intake/Output this shift: No intake/output data recorded.  Labs: Recent Labs    01/17/24 0332  HGB 10.0*   Recent Labs    01/17/24 0332  WBC 12.7*  RBC 3.30*  HCT 30.6*  PLT 244   Recent Labs    01/17/24 0332  NA 136  K 4.5  CL 102  CO2 25  BUN 19  CREATININE 0.72  GLUCOSE 166*  CALCIUM  8.6*   No results for input(s): LABPT, INR in the last 72 hours.  Exam: General - Patient is Alert and Oriented Extremity - Neurologically intact Neurovascular intact Sensation intact distally Dorsiflexion/Plantar flexion intact Dressing - dressing C/D/I Motor Function - intact, moving foot and toes well on exam.   Past Medical History:  Diagnosis Date   Arthritis    Pneumonia     Assessment/Plan: 1 Day Post-Op Procedure(s) (LRB): ARTHROPLASTY, KNEE, TOTAL (Right) Principal Problem:   Right knee DJD  Estimated body mass index is 27 kg/m as calculated from the following:   Height as of this encounter: 6' 3 (1.905 m).   Weight as of this encounter: 98 kg. Advance diet Up with therapy D/C IV fluids  DVT Prophylaxis - Aspirin  Weight bearing as tolerated. Continue therapy.  Plan is to go Home  after hospital stay. Plan for discharge to home today once cleared with PT.  HHPT ordered  Follow-up in the office in 2 weeks  Roxie Mess, PA-C Orthopedic Surgery [phone] 01/17/2024, 7:41 AM

## 2024-01-17 NOTE — TOC Transition Note (Signed)
 Transition of Care [facility]) - Discharge Note   Patient Details  Name: Patrick Fisher MRN: [id number] Date of Birth: [birth year]  [facility] CM/SW Contact:  Alfonse JONELLE Rex, RN Phone Number: 01/17/2024, 12:44 PM   Clinical Narrative:   Per bedside nurse patient needs RW.  RW obtained from [facility] equipment closed in PACU, delivered to patient at bedside,  proof of delivery paperwork signed by patient's spouse. No further INPT CM needs identified at this time.           Patient Goals and CMS Choice            Discharge Placement                       Discharge Plan and Services Additional resources added to the After Visit Summary for                                       Social Drivers of Health (SDOH) Interventions SDOH Screenings   Food Insecurity: No Food Insecurity (01/16/2024)  Housing: Low Risk  (01/16/2024)  Transportation Needs: No Transportation Needs (01/16/2024)  Utilities: Not At Risk (01/16/2024)  Social Connections: Moderately Isolated (01/16/2024)  Tobacco Use: Low Risk  (01/16/2024)     Readmission Risk Interventions     No data to display

## 2024-01-18 ENCOUNTER — Encounter (HOSPITAL_COMMUNITY): Payer: Self-pay | Admitting: Specialist
